# Patient Record
Sex: Female | Born: 1940 | Race: Asian | Hispanic: No | Smoking: Never smoker
Health system: Southern US, Community
[De-identification: ages and names within clinical notes are randomized; demographics above are authoritative.]

## PROBLEM LIST (undated history)

## (undated) DIAGNOSIS — E209 Hypoparathyroidism, unspecified: Secondary | ICD-10-CM

## (undated) DIAGNOSIS — I10 Essential (primary) hypertension: Secondary | ICD-10-CM

## (undated) DIAGNOSIS — K635 Polyp of colon: Secondary | ICD-10-CM

## (undated) DIAGNOSIS — R42 Dizziness and giddiness: Secondary | ICD-10-CM

## (undated) DIAGNOSIS — IMO0002 Reserved for concepts with insufficient information to code with codable children: Secondary | ICD-10-CM

## (undated) DIAGNOSIS — C50919 Malignant neoplasm of unspecified site of unspecified female breast: Secondary | ICD-10-CM

## (undated) DIAGNOSIS — E785 Hyperlipidemia, unspecified: Secondary | ICD-10-CM

## (undated) DIAGNOSIS — E039 Hypothyroidism, unspecified: Secondary | ICD-10-CM

## (undated) DIAGNOSIS — C801 Malignant (primary) neoplasm, unspecified: Secondary | ICD-10-CM

## (undated) HISTORY — DX: Essential (primary) hypertension: I10

## (undated) HISTORY — DX: Hyperlipidemia, unspecified: E78.5

## (undated) HISTORY — DX: Polyp of colon: K63.5

## (undated) HISTORY — DX: Malignant neoplasm of unspecified site of unspecified female breast: C50.919

## (undated) HISTORY — DX: Dizziness and giddiness: R42

## (undated) HISTORY — DX: Reserved for concepts with insufficient information to code with codable children: IMO0002

## (undated) HISTORY — DX: Hypothyroidism, unspecified: E03.9

## (undated) HISTORY — DX: Malignant (primary) neoplasm, unspecified: C80.1

## (undated) HISTORY — PX: THYROIDECTOMY: SHX17

## (undated) HISTORY — DX: Hypoparathyroidism, unspecified: E20.9

---

## 1993-11-10 ENCOUNTER — Encounter: Payer: Self-pay | Admitting: Family Medicine

## 1995-08-02 DIAGNOSIS — C50919 Malignant neoplasm of unspecified site of unspecified female breast: Secondary | ICD-10-CM

## 1995-08-02 HISTORY — DX: Malignant neoplasm of unspecified site of unspecified female breast: C50.919

## 1996-08-01 HISTORY — PX: BREAST BIOPSY: SHX20

## 1997-10-30 ENCOUNTER — Encounter: Admission: RE | Admit: 1997-10-30 | Discharge: 1998-01-28 | Payer: Self-pay | Admitting: Radiation Oncology

## 1998-03-30 ENCOUNTER — Other Ambulatory Visit: Admission: RE | Admit: 1998-03-30 | Discharge: 1998-03-30 | Payer: Self-pay | Admitting: *Deleted

## 1998-05-13 ENCOUNTER — Encounter: Payer: Self-pay | Admitting: Family Medicine

## 1999-02-24 ENCOUNTER — Other Ambulatory Visit: Admission: RE | Admit: 1999-02-24 | Discharge: 1999-02-24 | Payer: Self-pay | Admitting: *Deleted

## 1999-09-08 ENCOUNTER — Encounter: Admission: RE | Admit: 1999-09-08 | Discharge: 1999-09-08 | Payer: Self-pay | Admitting: Family Medicine

## 1999-09-08 ENCOUNTER — Encounter: Payer: Self-pay | Admitting: Family Medicine

## 1999-12-20 ENCOUNTER — Encounter: Admission: RE | Admit: 1999-12-20 | Discharge: 2000-01-05 | Payer: Self-pay | Admitting: Orthopedic Surgery

## 2000-02-25 ENCOUNTER — Other Ambulatory Visit: Admission: RE | Admit: 2000-02-25 | Discharge: 2000-02-25 | Payer: Self-pay | Admitting: *Deleted

## 2000-04-27 ENCOUNTER — Ambulatory Visit (HOSPITAL_COMMUNITY): Admission: RE | Admit: 2000-04-27 | Discharge: 2000-04-27 | Payer: Self-pay | Admitting: Gastroenterology

## 2000-04-27 ENCOUNTER — Encounter (INDEPENDENT_AMBULATORY_CARE_PROVIDER_SITE_OTHER): Payer: Self-pay | Admitting: *Deleted

## 2000-10-05 ENCOUNTER — Encounter: Admission: RE | Admit: 2000-10-05 | Discharge: 2000-10-05 | Payer: Self-pay | Admitting: Family Medicine

## 2000-10-05 ENCOUNTER — Encounter: Payer: Self-pay | Admitting: Family Medicine

## 2001-02-19 ENCOUNTER — Other Ambulatory Visit: Admission: RE | Admit: 2001-02-19 | Discharge: 2001-02-19 | Payer: Self-pay | Admitting: *Deleted

## 2001-10-09 ENCOUNTER — Encounter: Payer: Self-pay | Admitting: Family Medicine

## 2001-10-09 ENCOUNTER — Encounter: Admission: RE | Admit: 2001-10-09 | Discharge: 2001-10-09 | Payer: Self-pay | Admitting: Family Medicine

## 2001-10-19 ENCOUNTER — Encounter: Payer: Self-pay | Admitting: Family Medicine

## 2001-12-13 ENCOUNTER — Other Ambulatory Visit: Admission: RE | Admit: 2001-12-13 | Discharge: 2001-12-13 | Payer: Self-pay | Admitting: *Deleted

## 2002-05-07 ENCOUNTER — Ambulatory Visit: Admission: RE | Admit: 2002-05-07 | Discharge: 2002-05-07 | Payer: Self-pay | Admitting: Radiation Oncology

## 2002-10-11 ENCOUNTER — Encounter: Admission: RE | Admit: 2002-10-11 | Discharge: 2002-10-11 | Payer: Self-pay | Admitting: Family Medicine

## 2002-10-11 ENCOUNTER — Encounter: Payer: Self-pay | Admitting: Family Medicine

## 2003-01-02 ENCOUNTER — Other Ambulatory Visit: Admission: RE | Admit: 2003-01-02 | Discharge: 2003-01-02 | Payer: Self-pay | Admitting: *Deleted

## 2003-10-14 ENCOUNTER — Encounter: Admission: RE | Admit: 2003-10-14 | Discharge: 2003-10-14 | Payer: Self-pay | Admitting: Family Medicine

## 2004-01-07 ENCOUNTER — Other Ambulatory Visit: Admission: RE | Admit: 2004-01-07 | Discharge: 2004-01-07 | Payer: Self-pay | Admitting: *Deleted

## 2004-10-15 ENCOUNTER — Encounter: Admission: RE | Admit: 2004-10-15 | Discharge: 2004-10-15 | Payer: Self-pay

## 2005-01-04 ENCOUNTER — Encounter: Payer: Self-pay | Admitting: Family Medicine

## 2005-02-24 ENCOUNTER — Other Ambulatory Visit: Admission: RE | Admit: 2005-02-24 | Discharge: 2005-02-24 | Payer: Self-pay | Admitting: *Deleted

## 2005-05-12 ENCOUNTER — Encounter (INDEPENDENT_AMBULATORY_CARE_PROVIDER_SITE_OTHER): Payer: Self-pay | Admitting: *Deleted

## 2005-05-12 ENCOUNTER — Ambulatory Visit (HOSPITAL_COMMUNITY): Admission: RE | Admit: 2005-05-12 | Discharge: 2005-05-12 | Payer: Self-pay | Admitting: Gastroenterology

## 2005-10-26 ENCOUNTER — Encounter: Admission: RE | Admit: 2005-10-26 | Discharge: 2005-10-26 | Payer: Self-pay | Admitting: Family Medicine

## 2005-12-08 ENCOUNTER — Emergency Department (HOSPITAL_COMMUNITY): Admission: EM | Admit: 2005-12-08 | Discharge: 2005-12-08 | Payer: Self-pay | Admitting: Emergency Medicine

## 2006-01-18 ENCOUNTER — Ambulatory Visit: Payer: Self-pay | Admitting: Internal Medicine

## 2006-01-25 ENCOUNTER — Ambulatory Visit: Payer: Self-pay | Admitting: Internal Medicine

## 2006-08-01 LAB — CONVERTED CEMR LAB: Pap Smear: NORMAL

## 2006-08-29 ENCOUNTER — Encounter: Payer: Self-pay | Admitting: Family Medicine

## 2006-10-16 ENCOUNTER — Encounter: Admission: RE | Admit: 2006-10-16 | Discharge: 2006-10-16 | Payer: Self-pay | Admitting: Family Medicine

## 2006-10-19 ENCOUNTER — Encounter: Admission: RE | Admit: 2006-10-19 | Discharge: 2006-10-19 | Payer: Self-pay | Admitting: Family Medicine

## 2007-01-19 ENCOUNTER — Other Ambulatory Visit: Admission: RE | Admit: 2007-01-19 | Discharge: 2007-01-19 | Payer: Self-pay | Admitting: Obstetrics & Gynecology

## 2007-10-19 ENCOUNTER — Encounter: Admission: RE | Admit: 2007-10-19 | Discharge: 2007-10-19 | Payer: Self-pay | Admitting: Obstetrics & Gynecology

## 2008-01-11 ENCOUNTER — Encounter: Payer: Self-pay | Admitting: Family Medicine

## 2008-06-04 ENCOUNTER — Ambulatory Visit: Payer: Self-pay | Admitting: Internal Medicine

## 2008-06-18 ENCOUNTER — Ambulatory Visit: Payer: Self-pay | Admitting: Internal Medicine

## 2008-06-18 ENCOUNTER — Encounter: Payer: Self-pay | Admitting: Internal Medicine

## 2008-06-18 LAB — HM COLONOSCOPY

## 2008-06-20 ENCOUNTER — Encounter: Payer: Self-pay | Admitting: Internal Medicine

## 2008-08-19 ENCOUNTER — Encounter: Payer: Self-pay | Admitting: Family Medicine

## 2008-10-21 ENCOUNTER — Encounter: Admission: RE | Admit: 2008-10-21 | Discharge: 2008-10-21 | Payer: Self-pay | Admitting: Obstetrics & Gynecology

## 2008-10-21 LAB — HM MAMMOGRAPHY: HM Mammogram: NORMAL

## 2009-01-06 ENCOUNTER — Ambulatory Visit: Payer: Self-pay | Admitting: Family Medicine

## 2009-01-06 DIAGNOSIS — M5137 Other intervertebral disc degeneration, lumbosacral region: Secondary | ICD-10-CM | POA: Insufficient documentation

## 2009-01-06 DIAGNOSIS — R4589 Other symptoms and signs involving emotional state: Secondary | ICD-10-CM | POA: Insufficient documentation

## 2009-01-06 DIAGNOSIS — E039 Hypothyroidism, unspecified: Secondary | ICD-10-CM | POA: Insufficient documentation

## 2009-01-06 DIAGNOSIS — I10 Essential (primary) hypertension: Secondary | ICD-10-CM | POA: Insufficient documentation

## 2009-01-06 DIAGNOSIS — E785 Hyperlipidemia, unspecified: Secondary | ICD-10-CM

## 2009-01-06 DIAGNOSIS — M51379 Other intervertebral disc degeneration, lumbosacral region without mention of lumbar back pain or lower extremity pain: Secondary | ICD-10-CM | POA: Insufficient documentation

## 2009-02-04 ENCOUNTER — Telehealth: Payer: Self-pay | Admitting: Family Medicine

## 2009-02-12 ENCOUNTER — Telehealth: Payer: Self-pay | Admitting: Family Medicine

## 2009-02-20 ENCOUNTER — Ambulatory Visit: Payer: Self-pay | Admitting: Family Medicine

## 2009-02-20 DIAGNOSIS — Z8669 Personal history of other diseases of the nervous system and sense organs: Secondary | ICD-10-CM | POA: Insufficient documentation

## 2009-03-03 ENCOUNTER — Telehealth (INDEPENDENT_AMBULATORY_CARE_PROVIDER_SITE_OTHER): Payer: Self-pay | Admitting: *Deleted

## 2009-03-03 ENCOUNTER — Encounter: Payer: Self-pay | Admitting: Family Medicine

## 2009-03-05 ENCOUNTER — Encounter: Payer: Self-pay | Admitting: Family Medicine

## 2009-03-05 ENCOUNTER — Telehealth: Payer: Self-pay | Admitting: Family Medicine

## 2009-03-06 ENCOUNTER — Encounter: Payer: Self-pay | Admitting: Family Medicine

## 2009-03-09 ENCOUNTER — Telehealth: Payer: Self-pay | Admitting: Family Medicine

## 2009-03-09 ENCOUNTER — Encounter: Payer: Self-pay | Admitting: Family Medicine

## 2009-05-13 ENCOUNTER — Telehealth: Payer: Self-pay | Admitting: Family Medicine

## 2009-05-18 ENCOUNTER — Telehealth: Payer: Self-pay | Admitting: Family Medicine

## 2009-05-18 ENCOUNTER — Encounter: Payer: Self-pay | Admitting: Family Medicine

## 2009-05-22 ENCOUNTER — Ambulatory Visit: Payer: Self-pay | Admitting: Endocrinology

## 2009-05-22 ENCOUNTER — Other Ambulatory Visit: Admission: RE | Admit: 2009-05-22 | Discharge: 2009-05-22 | Payer: Self-pay | Admitting: Endocrinology

## 2009-05-22 ENCOUNTER — Encounter: Payer: Self-pay | Admitting: Endocrinology

## 2009-05-26 ENCOUNTER — Telehealth: Payer: Self-pay | Admitting: Endocrinology

## 2009-06-04 ENCOUNTER — Encounter: Admission: RE | Admit: 2009-06-04 | Discharge: 2009-06-04 | Payer: Self-pay | Admitting: General Surgery

## 2009-06-18 ENCOUNTER — Telehealth: Payer: Self-pay | Admitting: Internal Medicine

## 2009-06-30 ENCOUNTER — Ambulatory Visit (HOSPITAL_COMMUNITY): Admission: RE | Admit: 2009-06-30 | Discharge: 2009-07-01 | Payer: Self-pay | Admitting: General Surgery

## 2009-06-30 ENCOUNTER — Encounter (INDEPENDENT_AMBULATORY_CARE_PROVIDER_SITE_OTHER): Payer: Self-pay | Admitting: General Surgery

## 2009-07-02 ENCOUNTER — Telehealth: Payer: Self-pay | Admitting: Family Medicine

## 2009-08-19 ENCOUNTER — Ambulatory Visit: Payer: Self-pay | Admitting: Family Medicine

## 2009-08-19 LAB — CONVERTED CEMR LAB
AST: 36 units/L (ref 0–37)
Albumin: 4.4 g/dL (ref 3.5–5.2)
Alkaline Phosphatase: 53 units/L (ref 39–117)
BUN: 15 mg/dL (ref 6–23)
Basophils Absolute: 0 10*3/uL (ref 0.0–0.1)
Basophils Relative: 0.2 % (ref 0.0–3.0)
Bilirubin, Direct: 0.1 mg/dL (ref 0.0–0.3)
CO2: 31 meq/L (ref 19–32)
Chloride: 102 meq/L (ref 96–112)
Cholesterol: 250 mg/dL — ABNORMAL HIGH (ref 0–200)
Eosinophils Absolute: 0.1 10*3/uL (ref 0.0–0.7)
Glucose, Bld: 93 mg/dL (ref 70–99)
HDL: 70.3 mg/dL (ref >39.00)
Hemoglobin: 13.6 g/dL (ref 12.0–15.0)
Lymphs Abs: 2 10*3/uL (ref 0.7–4.0)
MCV: 98.1 fL (ref 78.0–100.0)
Monocytes Absolute: 0.3 10*3/uL (ref 0.1–1.0)
Monocytes Relative: 5.2 % (ref 3.0–12.0)
Neutro Abs: 2.5 10*3/uL (ref 1.4–7.7)
Neutrophils Relative %: 52.5 % (ref 43.0–77.0)
Potassium: 3.4 meq/L — ABNORMAL LOW (ref 3.5–5.1)
RBC: 4.18 M/uL (ref 3.87–5.11)
Total Bilirubin: 1 mg/dL (ref 0.3–1.2)
Total CHOL/HDL Ratio: 4
Total Protein: 7 g/dL (ref 6.0–8.3)
Triglycerides: 140 mg/dL (ref 0.0–149.0)
VLDL: 28 mg/dL (ref 0.0–40.0)

## 2009-08-21 ENCOUNTER — Ambulatory Visit: Payer: Self-pay | Admitting: Family Medicine

## 2009-08-21 DIAGNOSIS — Z780 Asymptomatic menopausal state: Secondary | ICD-10-CM | POA: Insufficient documentation

## 2009-08-21 DIAGNOSIS — C73 Malignant neoplasm of thyroid gland: Secondary | ICD-10-CM | POA: Insufficient documentation

## 2009-08-26 ENCOUNTER — Encounter: Payer: Self-pay | Admitting: Family Medicine

## 2009-08-31 ENCOUNTER — Encounter (HOSPITAL_COMMUNITY): Admission: RE | Admit: 2009-08-31 | Discharge: 2009-11-12 | Payer: Self-pay | Admitting: Endocrinology

## 2009-09-10 ENCOUNTER — Telehealth: Payer: Self-pay | Admitting: Family Medicine

## 2009-10-22 ENCOUNTER — Encounter: Admission: RE | Admit: 2009-10-22 | Discharge: 2009-10-22 | Payer: Self-pay | Admitting: Obstetrics & Gynecology

## 2009-10-30 ENCOUNTER — Ambulatory Visit: Payer: Self-pay | Admitting: Internal Medicine

## 2009-10-30 ENCOUNTER — Encounter: Payer: Self-pay | Admitting: Family Medicine

## 2009-11-16 ENCOUNTER — Encounter (INDEPENDENT_AMBULATORY_CARE_PROVIDER_SITE_OTHER): Payer: Self-pay | Admitting: *Deleted

## 2010-02-12 ENCOUNTER — Ambulatory Visit: Payer: Self-pay | Admitting: Family Medicine

## 2010-02-12 DIAGNOSIS — F4321 Adjustment disorder with depressed mood: Secondary | ICD-10-CM

## 2010-03-03 ENCOUNTER — Telehealth: Payer: Self-pay | Admitting: Family Medicine

## 2010-04-16 ENCOUNTER — Ambulatory Visit: Payer: Self-pay | Admitting: Family Medicine

## 2010-04-17 ENCOUNTER — Encounter: Payer: Self-pay | Admitting: Family Medicine

## 2010-04-19 LAB — CONVERTED CEMR LAB
Alkaline Phosphatase: 66 units/L (ref 39–117)
BUN: 15 mg/dL (ref 6–23)
Calcium: 10.1 mg/dL (ref 8.4–10.5)
Cholesterol: 193 mg/dL (ref 0–200)
HDL: 58 mg/dL (ref >39)
LDL Cholesterol: 83 mg/dL (ref 0–99)
Potassium: 3.8 meq/L (ref 3.5–5.3)
Total Bilirubin: 0.4 mg/dL (ref 0.3–1.2)
Total CHOL/HDL Ratio: 3.3
Total Protein: 7.3 g/dL (ref 6.0–8.3)
VLDL: 52 mg/dL — ABNORMAL HIGH (ref 0–40)

## 2010-04-20 ENCOUNTER — Encounter: Payer: Self-pay | Admitting: Family Medicine

## 2010-04-26 ENCOUNTER — Ambulatory Visit (HOSPITAL_COMMUNITY): Admission: RE | Admit: 2010-04-26 | Discharge: 2010-04-26 | Payer: Self-pay | Admitting: Endocrinology

## 2010-05-10 ENCOUNTER — Ambulatory Visit: Payer: Self-pay | Admitting: Family Medicine

## 2010-05-10 DIAGNOSIS — G250 Essential tremor: Secondary | ICD-10-CM

## 2010-05-10 DIAGNOSIS — G252 Other specified forms of tremor: Secondary | ICD-10-CM

## 2010-06-02 ENCOUNTER — Emergency Department (HOSPITAL_COMMUNITY): Admission: EM | Admit: 2010-06-02 | Discharge: 2010-06-02 | Payer: Self-pay | Admitting: Emergency Medicine

## 2010-06-04 ENCOUNTER — Ambulatory Visit: Payer: Self-pay | Admitting: Family Medicine

## 2010-06-15 ENCOUNTER — Telehealth: Payer: Self-pay | Admitting: Family Medicine

## 2010-06-16 ENCOUNTER — Encounter: Payer: Self-pay | Admitting: Family Medicine

## 2010-06-22 ENCOUNTER — Telehealth: Payer: Self-pay | Admitting: Family Medicine

## 2010-06-28 ENCOUNTER — Telehealth: Payer: Self-pay | Admitting: Family Medicine

## 2010-06-28 ENCOUNTER — Encounter: Payer: Self-pay | Admitting: Family Medicine

## 2010-07-08 ENCOUNTER — Ambulatory Visit: Payer: Self-pay | Admitting: Family Medicine

## 2010-07-08 DIAGNOSIS — B0229 Other postherpetic nervous system involvement: Secondary | ICD-10-CM | POA: Insufficient documentation

## 2010-07-08 LAB — CONVERTED CEMR LAB
Cholesterol, target level: 200 mg/dL
HDL goal, serum: 40 mg/dL
LDL Goal: 130 mg/dL

## 2010-07-16 LAB — CONVERTED CEMR LAB: Free T4: 1.75 ng/dL — ABNORMAL HIGH (ref 0.60–1.60)

## 2010-07-30 ENCOUNTER — Encounter: Payer: Self-pay | Admitting: Family Medicine

## 2010-08-01 HISTORY — PX: LARYNGECTOMY: SUR815

## 2010-08-10 ENCOUNTER — Encounter: Payer: Self-pay | Admitting: Family Medicine

## 2010-08-27 ENCOUNTER — Ambulatory Visit
Admission: RE | Admit: 2010-08-27 | Discharge: 2010-08-27 | Payer: Self-pay | Source: Home / Self Care | Attending: Family Medicine | Admitting: Family Medicine

## 2010-08-27 ENCOUNTER — Other Ambulatory Visit: Payer: Self-pay | Admitting: Family Medicine

## 2010-08-27 LAB — T4, FREE: Free T4: 2.08 ng/dL — ABNORMAL HIGH (ref 0.60–1.60)

## 2010-09-02 NOTE — Assessment & Plan Note (Signed)
Summary: F/U/CLE   Vital Signs:  Patient profile:   70 year old female Height:      58.25 inches Weight:      120.50 pounds BMI:     25.06 Temp:     98.1 degrees F oral Pulse rate:   80 / minute Pulse rhythm:   regular BP sitting:   146 / 84  (left arm) Cuff size:   regular  Vitals Entered By: Lewanda Rife LPN (February 12, 2010 1:42 PM) CC: follow-up visit   History of Present Illness: here for f/u of HTN and lipid and hypothyroid  has lost husband since last visit- in april of cardiac problem  is seeing a counselor for grief  has good friends and her son came back home -- is living with him now   was started on thyroid repl at 100 micrograms by Dr Evlyn Kanner s/p thyroidect  next appt until next mo - may have to do a bit more surgery-  ? unsure  has 3% of thyroid left on R side because it was close to vocal cords  had to take oidine tx for that   is off the thyroid repl now until ? done with iodine   is on viibryd for antidepressant -- is starting to help a little   had a resp illness with phlem in throat - was tx with abx for 2 weeks  then went to ENT doctor (Dr Pollyann Kennedy) is chronically hoarse - ? damaged R side of nerve ? if voice training will help  wt is down 20 lb not much appetite with the grief  now is eating anything she can eat  bp up a bit on first check 146/84 was upset talking during this     Allergies: 1)  ! Prednisone  Past History:  Past Surgical History: Last updated: 07/01/2009  1998 Breast BX and radiation tx -- breast cancer  Bunion surgery nl dexa 6/06 R total thyroidectomy 11/10  Family History: Last updated: 08/21/2009 Family History of Arthritis (parent, other relative) Family History of Colon CA (other relative) Family History High cholesterol(parent, other relative) Family History Hypertension(parent, other relative) Family History Stroke (parent) Family History Heart Disease (parent) sister has uncertain type of thyroid  problem. 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 2 sons  one grandson  lost husb to coronary artery disease in 4/11 moved from [place] in 1987 Never Smoked Alcohol use-no Drug use-no Regular exercise-yes  Risk Factors: Exercise: yes (01/06/2009)  Risk Factors: Smoking Status: never (01/06/2009)  Past Medical History: lumbar deg disk - with back pain (s/p injections) Thyroid problem Vertigo 2007/2010 hearing loss- aide Hyperlipidemia Hypertension colon polyps breast cancer - caught very early -- bx was curative, then 56 radiology treatment  adenocarcinoma thyroid with thyroidectomy  GI-Dr Marina Goodell  ortho- Dr Priscille Kluver, Dr Alvester Morin (spine injection) surg - Dr Luan Pulling  ENT - Pollyann Kennedy surg- Dr Zachery Dakins  Social History: 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 Eyes:  Denies blurring and eye irritation. CV:  Denies chest pain or discomfort, lightheadness, and shortness of breath with exertion. Resp:  Denies cough and wheezing. GI:  Denies abdominal pain,  change in bowel habits, indigestion, and nausea. GU:  Denies urinary frequency. MS:  Complains of stiffness; denies joint redness, joint swelling, and muscle aches. Derm:  Denies itching, lesion(s), poor wound healing, and rash. Neuro:  Denies headaches, numbness, and tingling. Psych:  Complains of depression; denies panic attacks, sense of great danger, and suicidal thoughts/plans. Endo:  Complains of cold intolerance; denies excessive thirst, excessive urination, and heat intolerance. Heme:  Denies abnormal bruising and bleeding.  Physical Exam  General:  fatigued but well  appearing wt loss noted  Head:  normocephalic, atraumatic, and no abnormalities observed.   Eyes:  vision grossly intact, pupils equal, pupils round, and pupils reactive to light.  no conjunctival pallor, injection or icterus  no exopthalmos Mouth:  pharynx pink and moist.   hoarse voice noted Neck:  supple with full rom and no masses or thyromegally, no JVD or carotid bruit  healed thyroid scar  Chest Wall:  No deformities, masses, or tenderness noted. Lungs:  Normal respiratory effort, chest expands symmetrically. Lungs are clear to auscultation, no crackles or wheezes. Heart:  Normal rate and regular rhythm. S1 and S2 normal without gallop, murmur, click, rub or other extra sounds. Msk:  No deformity or scoliosis noted of thoracic or lumbar spine.   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 1/2  tab by mouth daily    Losartan Potassium 100 Mg Tabs (Losartan potassium) .Marland Kitchen... Take 1 tablet by mouth once a day  BP today: 146/84 Prior BP: 130/70 (08/21/2009)  Labs Reviewed: K+: 3.4 (08/19/2009) Creat: : 1.0 (08/19/2009)   Chol: 250 (08/19/2009)   HDL: 70.30 (08/19/2009)   TG: 140.0  (08/19/2009)  Problem # 3:  GRIEF REACTION, ACUTE (ICD-309.0) Assessment: New disc this in detail  symptoms are improving gradually and pt continues med and counseling-I enc this  her own health problems are compounding the depressive aspect  did stress imp of seeking help if symptoms worsen or any thoughts of self harm- pt voiced understanding   Complete Medication List: 1)  Chlorthalidone 25 Mg Tabs (Chlorthalidone) .... 1/2  tab by mouth daily 2)  K Cl 20 Meq  .... Take 1 tablet by mouth once a day 3)  Simvastatin 80 Mg Tabs (Simvastatin) .... Take 1 tablet by mouth once a day 4)  Aspirin 81 Mg Tabs (Aspirin) .... Take 1 tablet by mouth once a day 5)  Multivitamins Tabs (Multiple vitamin) .... Take 1 tablet by mouth once a day 6)  Oscal 500/200 D-3 500-200 Mg-unit Tabs (Calcium-vitamin d) .... Take 2 tablet by mouth once a day 7)  Losartan Potassium 100 Mg Tabs (Losartan potassium) .... Take 1 tablet by mouth once a day 8)  Viibryd 40mg   .Marland KitchenMarland KitchenMarland Kitchen  Take 1/2 tablet by mouth daily  Patient Instructions: 1)  please send for last note from ENT Dr Pollyann Kennedy  2)  please send for last note from surgeon- Dr Zachery Dakins 3)  continue your counseling 4)  continue your antidepressent medication  5)  continue follow up with Dr Evlyn Kanner  6)  please let me know if you need anything  7)  follow up with me in about 3 months (I will do labs that day)   Current Allergies (reviewed today): ! PREDNISONE

## 2010-09-02 NOTE — Progress Notes (Signed)
Summary: K CL rx  Phone Note Refill Request Call back at [phone] Message from:  Patient's husband,Ron on September 10, 2009 2:33 PM  Refills Requested: Medication #1:  K CL 20 MEQ Take 1 tablet by mouth once a day Pt's husband request a written rx for K CL for #90 with 3 additional refills mailed to his home address. Please advise.    Method Requested: Mail to Patient Initial call taken by: Lewanda Rife LPN,  September 10, 2009 2:37 PM  Follow-up for Phone Call        printed in put in nurse in box for pickup   Follow-up by: Judith Part MD,  September 11, 2009 8:01 AM  Additional Follow-up for Phone Call Additional follow up Details #1::        Prescription mailed to patient as instructed. Patient notified as instructed by telephone. Lewanda Rife LPN  September 11, 2009 8:36 AM     Prescriptions: K CL 20 MEQ Take 1 tablet by mouth once a day  #90 x 3   Entered and Authorized by:   Judith Part MD   Signed by:   Judith Part MD on 09/11/2009   Method used:   Print then Mail to Patient   RxID:   [id number]

## 2010-09-02 NOTE — Assessment & Plan Note (Signed)
Summary: Right hand shakes, discuss referral to neuro   Vital Signs:  Patient profile:   70 year old female Height:      58.25 inches Weight:      124.75 pounds BMI:     25.94 Temp:     98.3 degrees F oral Pulse rate:   84 / minute Pulse rhythm:   regular BP sitting:   128 / 80  (left arm) Cuff size:   regular  Vitals Entered By: Lewanda Rife LPN (May 10, 2010 11:58 AM) CC: right hand shakes, wants to talk about neurological referral   History of Present Illness: has had bilateral hand tremor for a long time -- started in 20s  thinks it is getting worse ? -- not too sure  friend is worried about parkinsons  sister thinks she has a head tremor  she does not notice it   hand tremor is on intention  not at rest  does not interfere with activity   wants to continue antihistamine at night for post nasal drip   Allergies: 1)  ! Prednisone  Past History:  Past Medical History: Last updated: 02/12/2010 lumbar deg disk - with back pain (s/p injections) Thyroid problem Vertigo 2007/2010 hearing loss- aide Hyperlipidemia Hypertension colon polyps breast cancer - caught very early -- bx was curative, then 56 radiology treatment  adenocarcinoma thyroid with thyroidectomy  GI-Dr Marina Goodell  ortho- Dr Priscille Kluver, Dr Alvester Morin (spine injection) surg - Dr Luan Pulling  ENT - Pollyann Kennedy surg- Dr Zachery Dakins  Past Surgical History: Last updated: 07/01/2009  1998 Breast BX and radiation tx -- breast cancer  Bunion surgery nl dexa 6/06 R total thyroidectomy 11/10  Family History: Last updated: 08/21/2009 Family History of Arthritis (parent, other relative) Family History of Colon CA (other relative) Family History High cholesterol(parent, other relative) Family History Hypertension(parent, other relative) Family History Stroke (parent) Family History Heart Disease (parent) sister has uncertain type of thyroid problem. 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 Eyes:  Denies blurring and double vision. CV:  Denies chest pain or discomfort, lightheadness, palpitations, and shortness of breath with exertion. Resp:  Denies cough and shortness of breath. GI:  Denies abdominal pain, change in bowel habits, and indigestion. MS:  Complains of stiffness; denies muscle aches, cramps, and muscle weakness. Derm:  Denies rash. Neuro:  Denies falling down, headaches, memory loss, numbness, sensation of room spinning, tingling, visual disturbances, and weakness. Endo:  Denies cold intolerance, excessive thirst, excessive urination, and heat intolerance. Heme:  Denies abnormal bruising and bleeding.  Physical Exam  General:  fatigued but well appearing wt loss noted  Head:  normocephalic, atraumatic, and no abnormalities observed.  no head tremor noted today Eyes:  vision grossly intact, pupils equal, pupils round, and pupils reactive to light.  no conjunctival pallor, injection or icterus  Mouth:  pharynx pink and moist.   Neck:  supple with full rom and no masses or thyromegally, no JVD or carotid bruit  healed thyroid scar  Lungs:  Normal respiratory effort, chest expands symmetrically. Lungs are clear to auscultation, no crackles or  wheezes. Heart:  Normal rate and regular rhythm. S1 and S2 normal without gallop, murmur, click, rub or other extra sounds. 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 1/2  tab by mouth daily 2)  K Cl 20 Meq  .... Take 1 tablet by mouth once a day 3)  Simvastatin 80 Mg Tabs (Simvastatin) .... Take 1 tablet by mouth once a day 4)  Aspirin 81 Mg Tabs (Aspirin) .... Take 1 tablet by mouth once a day 5)  Multivitamins Tabs (Multiple vitamin) .... Take 1 tablet by mouth once a day 6)  Oscal 500/200 D-3 500-200 Mg-unit Tabs (Calcium-vitamin d) .... Take 2 tablet by mouth once a day 7)  Losartan Potassium 100 Mg Tabs (Losartan potassium) .... Take 1 tablet by mouth once a day 8)  Viibryd 40mg   .... 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 PREDNISONE

## 2010-09-02 NOTE — Letter (Signed)
Summary: [facility]  [facility]   Imported By: Maryln Gottron 07/16/2010 14:38:46  _____________________________________________________________________  External Attachment:    Type:   Image     Comment:   External Document

## 2010-09-02 NOTE — Miscellaneous (Signed)
Summary: BONE DENSITY  Clinical Lists Changes  Orders: Added new Test order of T-Bone Densitometry (77080) - Signed Added new Test order of T-Lumbar Vertebral Assessment (77082) - Signed 

## 2010-09-02 NOTE — Progress Notes (Signed)
Summary: Wants pain med  Phone Note Call from Patient Call back at [phone]   Caller: Patient Call For: Judith Part MD Summary of Call: Pt said has finished Valtrex and Percocet. Pt said rash is looking better and drying up. Pt got the cream and it has helped but the Aleve or Advil is not helping the pain.  Pt said Percocet helped the pain. Pt request pain med to be called in to [facility]. Pt will decide which [facility] and call me back with that info.Lewanda Rife LPN  June 22, 2010 12:45 PM   Pt uses [facility].  Lowella Petties CMA, AAMA  June 22, 2010 12:59 PM   Follow-up for Phone Call        percocet is very strong so I'm going to step down a bit to vicodin which is able to be called in  also -- I advise she should start some low dose neurontin two times a day--- this is a special drug for nerve pain (since the pain is lasting a while) expect to be a bit dizzy and sedated for first few days -- most of the time side eff stop after that follow up with me in 1-2 weeks for re check please px written on EMR for call in  Follow-up by: Judith Part MD,  June 22, 2010 3:17 PM  Additional Follow-up for Phone Call Additional follow up Details #1::        Medication phoned to [facility] pharmacy as instructed. Patient notified as instructed by telephone. Pt scheduled appt to see Dr Milinda Antis 07/07/10 at 3pm.Rena Isley LPN  June 22, 2010 4:05 PM     New/Updated Medications: VICODIN 5-500 MG TABS (HYDROCODONE-ACETAMINOPHEN) 1-2 by mouth up to every 6 hours as needed severe pain watch for sedation NEURONTIN 300 MG CAPS (GABAPENTIN) 1 by mouth two times a day for shingles pain  caution for sedation Prescriptions: NEURONTIN 300 MG CAPS (GABAPENTIN) 1 by mouth two times a day for shingles pain  caution for sedation  #60 x 3   Entered and Authorized by:   Judith Part MD   Signed by:   Lewanda Rife LPN on 40/98/1191   Method used:   Telephoned to ...       [facility]       [address]       [place], [place]  [address]       Ph: [phone]       Fax: [phone]   RxID:   [id number] VICODIN 5-500 MG TABS (HYDROCODONE-ACETAMINOPHEN) 1-2 by mouth up to every 6 hours as needed severe pain watch for sedation  #30 x 0   Entered and Authorized by:   Judith Part MD   Signed by:   Lewanda Rife LPN on 44/09/4740   Method used:   Telephoned to ...       [facility]       [address]       [place], [place]  [address]       Ph: [phone]       Fax: [phone]   RxID:   [id number]

## 2010-09-02 NOTE — Assessment & Plan Note (Signed)
Summary: cpx/bir   Vital Signs:  Patient profile:   70 year old female Height:      58.25 inches Weight:      141 pounds BMI:     29.32 Temp:     97.6 degrees F oral Pulse rate:   76 / minute Pulse rhythm:   regular BP sitting:   130 / 70  (left arm) Cuff size:   regular  Vitals Entered By: Lowella Petties CMA (August 21, 2009 2:21 PM) CC: 30 minute check up   History of Present Illness: here for check up of chronic medical problems and also to disc chronic med problems does not feel up to doing PE today however   pt had thyroidectomy for papillary carcinoma since last visit is feeling terrible without any thyroid hormone -- and face feels swollen, and having trouble with concentration had appt with Dr Evlyn Kanner next wed y (had to re schedule because of weather) sees Dr Zachery Dakins next week as well    wt is up 2 lb is eating fairly - not too bad   recent tsh - over 100 as expected without supplementation   lipid check high - with LDL 161 (this may also be aff by thyroid)  HTN - bp is well controlled 130/70  colonosc due 2014 (Dr Marina Goodell)  pap was 08  recent K 3.4  ast 49  mam 3/10 - normal  self exam   dexa nl in 06  does take her ca and vit D - had prev cut down her ca now ready to inc it   Td 09  is having a lot of phlegm - in back of throat -- ? cold or allergy   Allergies: 1)  ! Prednisone  Past History:  Past Medical History: Last updated: 07/01/2009 lumbar deg disk - with back pain (s/p injections) Thyroid problem Vertigo 2007/2010 hearing loss- aide Hyperlipidemia Hypertension colon polyps breast cancer - caught very early -- bx was curative, then 55 radiology treatment   GI-Dr Marina Goodell  ortho- Dr Priscille Kluver, Dr Alvester Morin (spine injection) surg - Dr Luan Pulling  ENT - [facility] surg- Dr Zachery Dakins  Past Surgical History: Last updated: 07/01/2009  1998 Breast BX and radiation tx -- breast cancer  Bunion surgery nl dexa 6/06 R total thyroidectomy  11/10  Family History: Last updated: 08/21/2009 Family History of Arthritis (parent, other relative) Family History of Colon CA (other relative) Family History High cholesterol(parent, other relative) Family History Hypertension(parent, other relative) Family History Stroke (parent) Family History Heart Disease (parent) sister has uncertain type of thyroid problem. 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 2 sons  one grandson  Married (husband from [place])-- [place] -- husband had CABG - bad CAD moved from [place] in 1987 Never Smoked Alcohol use-no Drug use-no Regular exercise-yes  Risk Factors: Exercise: yes (01/06/2009)  Risk Factors: Smoking Status: never (01/06/2009)  Family History: Family History of Arthritis (parent, other relative) Family History of Colon CA (other relative) Family History High cholesterol(parent, other relative) Family History Hypertension(parent, other relative) Family History Stroke (parent) Family History Heart Disease (parent) sister has uncertain type of thyroid problem. father - Alz  a lot of thyroid problems in family   Review of Systems General:  Complains of fatigue and malaise; denies chills, fever, and loss of appetite. Eyes:  Denies blurring, eye irritation, and eye pain. ENT:  Complains of nosebleeds; denies postnasal  drainage, sinus pressure, and sore throat. CV:  Denies chest pain or discomfort, palpitations, shortness of breath with exertion, and swelling of feet. Resp:  Denies cough, shortness of breath, and wheezing. GI:  Denies abdominal pain, change in bowel habits, and indigestion. GU:  Denies dysuria and urinary frequency. MS:  Complains of stiffness; denies joint pain. Derm:  Denies itching, lesion(s), poor wound healing, and rash. Neuro:  Denies numbness and tingling. Psych:  Denies suicidal thoughts/plans; feels generally down  -- very fatigued . Endo:  Complains of cold intolerance; denies excessive thirst, excessive urination, and heat intolerance. Heme:  Denies abnormal bruising and bleeding.  Physical Exam  General:  fatigued but well appearing Head:  normocephalic, atraumatic, and no abnormalities observed.   Eyes:  vision grossly intact, pupils equal, pupils round, and pupils reactive to light.  no conjunctival pallor, injection or icterus  Ears:  R ear normal and L ear normal.   Mouth:  pharynx pink and moist.   Neck:  supple with full rom and no masses or thyromegally, no JVD or carotid bruit  Lungs:  Normal respiratory effort, chest expands symmetrically. Lungs are clear to auscultation, no crackles or wheezes. Heart:  Normal rate and regular rhythm. S1 and S2 normal without gallop, murmur, click, rub or other extra sounds. 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 Take 1 tablet by mouth  once a day  Problem # 4:  HYPERTENSION (ICD-401.9) Assessment: Unchanged  very well controlled on current meds  disc watching sodium in diet  no change in med  check up in 3 mo  Her updated medication list for this problem includes:    Chlorthalidone 25 Mg Tabs (Chlorthalidone) .Marland Kitchen... 1/2  tab by mouth daily    Losartan Potassium 100 Mg Tabs (Losartan potassium) .Marland Kitchen... Take 1 tablet by mouth once a day  BP today: 130/70 Prior BP: 118/68 (05/22/2009)  Labs Reviewed: K+: 3.4 (08/19/2009) Creat: : 1.0 (08/19/2009)   Chol: 250 (08/19/2009)   HDL: 70.30 (08/19/2009)   TG: 140.0 (08/19/2009)  Complete Medication List: 1)  Chlorthalidone 25 Mg Tabs (Chlorthalidone) .... 1/2  tab by mouth daily 2)  K Cl 20 Meq  .... Take 1 tablet by mouth once a day 3)  Simvastatin 80 Mg Tabs (Simvastatin) .... Take 1 tablet by mouth once a day 4)  Aspirin 81 Mg Tabs (Aspirin) .... Take 1 tablet by mouth once a day 5)  Multivitamins Tabs (Multiple vitamin) .... Take 1 tablet by mouth once a day 6)  Oscal 500/200 D-3 500-200 Mg-unit Tabs (Calcium-vitamin d) .... Take 2 tablet by mouth once a day 7)  Losartan Potassium 100 Mg Tabs (Losartan potassium) .... Take 1 tablet by mouth once a day  Patient Instructions: 1)  you can raise your HDL (good cholesterol) by increasing exercise and eating omega 3 fatty acid supplement like fish oil or flax seed oil over the counter 2)  you can lower LDL (bad cholesterol) by limiting saturated fats in diet like red meat, fried foods, egg yolks, fatty breakfast meats, high fat dairy products and shellfish  3)  when your thyroid is back in balance- I expect your cholesterol and energy level will improve  4)  your potassium is a little low- try to eat bananas and drink orange juice  5)  see Dr Evlyn Kanner for your thyroid treatment plan  6)  schedule appt  in 3 mo for 30 min check up and gyn exam  7)  the current recommendation for calcium intake is 1200-1500 mg daily with 1000 IU of  vitamin D  8)  we will set up bone density test at check up   Prior Medications (reviewed today): CHLORTHALIDONE 25 MG TABS (CHLORTHALIDONE) 1/2  tab by mouth daily K CL 20 MEQ () Take 1 tablet by mouth once a day SIMVASTATIN 80 MG TABS (SIMVASTATIN) Take 1 tablet by mouth once a day ASPIRIN 81 MG  TABS (ASPIRIN) Take 1 tablet by mouth once a day MULTIVITAMINS   TABS (MULTIPLE VITAMIN) Take 1 tablet by mouth once a day OSCAL 500/200 D-3 500-200 MG-UNIT TABS (CALCIUM-VITAMIN D) Take 2 tablet by mouth once a day LOSARTAN POTASSIUM 100 MG TABS (LOSARTAN POTASSIUM) Take 1 tablet by mouth once a day Current Allergies: ! PREDNISONE

## 2010-09-02 NOTE — Letter (Signed)
Summary: Results Follow up Letter  [facility]  [address] [place], [place] [address]   Phone: [phone]  Fax: [phone]    11/16/2009 MRN: [id number]    Gloria Parker [address] [place], [place]  [address]    Dear Ms. Mast,  The following are the results of your recent test(s):  Test         Result    Pap Smear:        Normal _____  Not Normal _____ Comments: ______________________________________________________ Cholesterol: LDL(Bad cholesterol):         Your goal is less than:         HDL (Good cholesterol):       Your goal is more than: Comments:  ______________________________________________________ Mammogram:        Normal _____  Not Normal _____ Comments:  ___________________________________________________________________ Hemoccult:        Normal _____  Not normal _______ Comments:    _____________________________________________________________________ Other Tests:   Bone Density Test:  Dexa scan shows normal bone density score. We will check it again in 2 years. The current recommendation for calcium intake is 1200-1500 mg daily with [phone] IU of vitamin D  We routinely do not discuss normal results over the telephone.  If you desire a copy of the results, or you have any questions about this information we can discuss them at your next office visit.   Sincerely,    Marne A. Milinda Antis, M.D.  MAT:lsf

## 2010-09-02 NOTE — Assessment & Plan Note (Signed)
Summary: ROA FOR SHINGLES/JRR   Vital Signs:  Patient profile:   70 year old female Height:      58.25 inches Weight:      121.75 pounds BMI:     25.32 Temp:     98.5 degrees F oral Pulse rate:   76 / minute Pulse rhythm:   regular BP sitting:   100 / 64  (left arm) Cuff size:   regular  Vitals Entered By: Lewanda Rife LPN (July 08, 2010 12:15 PM) CC: follow-up visit of shingles, Lipid Management   History of Present Illness: here for f/u of shingles with post herpetic neuralgia is on neurontin 300 two times a day and vicodin as needed  the rash is just about gone  still having pain in arm  gabapentin is helping a little -- is taking one instead of 2 per day  gets sleepy with it   she did hit her hand and had a lot of pain- that is improved now  pain is over arm and hand for the most part  is going to her grandaughters house next week  is expecting a baby as well  wanted to make sure she was not contagious     wt is down 11 lb  new thyroid Dr at [facility] is planning more surgery still has cancer from thyroid on " voicebox"== since iodine did not clear that -- so has to have more surgery on the end of the month   needs tsh today  Lipid Management History:      Positive NCEP/ATP III risk factors include female age 64 years old or older and hypertension.  Negative NCEP/ATP III risk factors include non-tobacco-user status.    Allergies: 1)  ! Prednisone  Past History:  Past Medical History: Last updated: 02/12/2010 lumbar deg disk - with back pain (s/p injections) Thyroid problem Vertigo 2007/2010 hearing loss- aide Hyperlipidemia Hypertension colon polyps breast cancer - caught very early -- bx was curative, then 55 radiology treatment  adenocarcinoma thyroid with thyroidectomy  GI-Dr Marina Goodell  ortho- Dr Priscille Kluver, Dr Alvester Morin (spine injection) surg - Dr Luan Pulling  ENT - [facility]- Dr Zachery Dakins  Past Surgical History: Last updated: 07/01/2009  1998 Breast  BX and radiation tx -- breast cancer  Bunion surgery nl dexa 6/06 R total thyroidectomy 11/10  Family History: Last updated: 08/21/2009 Family History of Arthritis (parent, other relative) Family History of Colon CA (other relative) Family History High cholesterol(parent, other relative) Family History Hypertension(parent, other relative) Family History Stroke (parent) Family History Heart Disease (parent) sister has uncertain type of thyroid problem. 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 Eyes:  Denies blurring and eye irritation. CV:  Denies chest pain or discomfort, lightheadness, and palpitations. Resp:  Denies cough, shortness of breath, and wheezing. GI:  Denies abdominal pain, change in bowel habits, and indigestion. GU:  Denies urinary frequency. MS:  Complains of joint pain; denies joint redness, joint swelling, and cramps. Derm:  Denies itching, lesion(s), poor wound healing, and rash. Neuro:  Denies numbness and tingling. Psych:  Denies anxiety and depression. Endo:  Denies cold intolerance, excessive thirst, excessive urination, and heat intolerance. Heme:  Denies abnormal bruising and bleeding.  Physical Exam  General:  fatigued but well appearing  hoarse voice is baseline Head:  normocephalic, atraumatic, and no abnormalities observed.   Eyes:  vision grossly intact, pupils equal, pupils round, and pupils reactive to light.   Mouth:  pharynx pink and moist.   Neck:  No deformities, masses, or tenderness noted. Chest Wall:  No deformities, masses,  or tenderness noted. Lungs:  Normal respiratory effort, chest expands symmetrically. Lungs are clear to auscultation, no crackles or wheezes. Heart:  Normal rate and regular rhythm. S1 and S2 normal without gallop, murmur, click, rub or other extra sounds. Abdomen:  Bowel sounds positive,abdomen soft and non-tender without masses, organomegaly or hernias noted. no renal bruits  Msk:  no acute joint changes  no tender cervical vertebrea  Pulses:  R and L carotid,radial,femoral,dorsalis pedis and posterior tibial pulses are full and equal bilaterally Extremities:  No clubbing, cyanosis, edema, or deformity noted with normal full range of motion of all joints.   Neurologic:  sensation intact to light touch, gait normal, and DTRs symmetrical and normal.   Skin:  erythematous vesiclar rash over lateral arm and palm -- in clusters resembling shingles no pustules rash is tender Cervical Nodes:  No lymphadenopathy noted Psych:  good attitude.. is fatigued but talkative and positive   Impression & Recommendations:  Problem # 1:  POSTHERPETIC NEURALGIA (ICD-053.19) Assessment Improved with post herpetic neuralgia will incr her gabapentin as tol to 600 two times a day and update vicodin as needed - with caution hopefully will improve with time given handout from [facility] on this condition  Problem # 2:  HYPOTHYROIDISM (ICD-244.9) Assessment: Unchanged  s/p thyrodectomy check tsh and free T4 and adj dose accordingly pt unsure if her new Dr at [facility] will take this over or not  has plan to remove last 3% of thyroid on throat -- in december  Her updated medication list for this problem includes:    Synthroid 50 Mcg Tabs (Levothyroxine sodium) .Marland Kitchen... 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 1/2  tab by mouth daily 2)  K Cl 20 Meq  .... Take 1 tablet by mouth once a day 3)  Simvastatin 80 Mg Tabs (Simvastatin) .... Take 1 tablet by mouth once a day 4)  Aspirin 81 Mg Tabs (Aspirin) .... Take 1 tablet by mouth once a day 5)  Multivitamins Tabs (Multiple vitamin) .... Take 1 tablet by mouth once a day 6)  Oscal 500/200 D-3 500-200 Mg-unit Tabs (Calcium-vitamin d) .... Take 2 tablet by mouth once a day 7)  Losartan Potassium 100 Mg Tabs (Losartan potassium) .... Take 1 tablet by mouth once a day 8)  Viibryd 40mg   .... Take 1/2 tablet by mouth daily or as directed 9)  Vicodin 5-500 Mg Tabs (Hydrocodone-acetaminophen) .Marland Kitchen.. 1-2 by mouth up to every 6 hours as needed severe pain watch for sedation 10)  Neurontin 300 Mg Caps (Gabapentin) .Marland KitchenMarland KitchenMarland Kitchen  2 by mouth daily for shingles pain  caution for sedation 11)  Synthroid 50 Mcg Tabs (Levothyroxine sodium) .Marland Kitchen.. 1 by mouth once daily  Other Orders: Prescription Created Electronically [phone])  Lipid Assessment/Plan:      Based on NCEP/ATP III, the patient's risk factor category is "2 or more risk factors and a calculated 10 year CAD risk of < 20%".  The patient's lipid goals are as follows: Total cholesterol goal is 200; LDL cholesterol goal is 130; HDL cholesterol goal is 40; Triglyceride goal is 150.     Patient Instructions: 1)  you can increase the neurontin (gabapentin ) to 2 pills twice daily for the pain from shingles  2)  only take the vicodin for breakthrough pain -- only take it if you really have to for severe pain 3)  checking your thyroid today then will update you with a plan Prescriptions: NEURONTIN 300 MG CAPS (GABAPENTIN) 2  by mouth daily for shingles pain  caution for sedation  #1 month x 5   Entered and Authorized by:   Judith Part MD   Signed by:   Judith Part MD on 07/08/2010   Method used:   [facility]        [facility]  [address] [phone]* (retail)       [address]       [place], [place]  [id number]       Ph: [phone] or [phone]       Fax: [phone]   RxID:   [id number]    Orders Added: 1)  Venipuncture [36415] 2)  TLB-TSH (Thyroid Stimulating Hormone) [84443-TSH] 3)  TLB-T4 (Thyrox), Free [95188-CZ6S] 4)  Prescription Created Electronically [[id number]] 5)  Est. Patient Level IV [06301]    Current Allergies (reviewed today): ! PREDNISONE

## 2010-09-02 NOTE — Progress Notes (Signed)
Summary: needs written scripts for mail order  Phone Note Refill Request Message from:  Patient  Refills Requested: Medication #1:  CHLORTHALIDONE 25 MG TABS 1/2  tab by mouth daily  Medication #2:  SIMVASTATIN 80 MG TABS Take 1 tablet by mouth once a day  Medication #3:  LOSARTAN POTASSIUM 100 MG TABS Take 1 tablet by mouth once a day Pt needs 90 written scripts to send to mail order pharmacy.  Please call her when  ready and she will pick up.  Initial call taken by: Lowella Petties CMA,  March 03, 2010 3:25 PM  Follow-up for Phone Call        printed in put in nurse in box for pickup  Follow-up by: Judith Part MD,  March 04, 2010 8:44 AM  Additional Follow-up for Phone Call Additional follow up Details #1::        Patient notified as instructed by telephone. Prescription left at front desk. Lewanda Rife LPN  March 04, 2010 11:10 AM     New/Updated Medications: CHLORTHALIDONE 25 MG TABS (CHLORTHALIDONE) 1/2  tab by mouth daily SIMVASTATIN 80 MG TABS (SIMVASTATIN) Take 1 tablet by mouth once a day LOSARTAN POTASSIUM 100 MG TABS (LOSARTAN POTASSIUM) Take 1 tablet by mouth once a day Prescriptions: LOSARTAN POTASSIUM 100 MG TABS (LOSARTAN POTASSIUM) Take 1 tablet by mouth once a day  #90 x 3   Entered and Authorized by:   Judith Part MD   Signed by:   Judith Part MD on 03/04/2010   Method used:   Print then Give to Patient   RxID:   [id number] SIMVASTATIN 80 MG TABS (SIMVASTATIN) Take 1 tablet by mouth once a day  #90 x 3   Entered and Authorized by:   Judith Part MD   Signed by:   Judith Part MD on 03/04/2010   Method used:   Print then Give to Patient   RxID:   [id number] CHLORTHALIDONE 25 MG TABS (CHLORTHALIDONE) 1/2  tab by mouth daily  #45 x 3   Entered and Authorized by:   Judith Part MD   Signed by:   Judith Part MD on 03/04/2010   Method used:   Print then Give to Patient   RxID:   [id number]

## 2010-09-02 NOTE — Letter (Signed)
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

## 2010-09-02 NOTE — Letter (Signed)
Summary: [facility]  [facility]   Imported By: Maryln Gottron 07/16/2010 14:37:10  _____________________________________________________________________  External Attachment:    Type:   Image     Comment:   External Document

## 2010-09-02 NOTE — Letter (Signed)
Summary: Meds/[facility]  Meds/[facility]   Imported By: Lanelle Bal 08/19/2010 08:13:03  _____________________________________________________________________  External Attachment:    Type:   Image     Comment:   External Document

## 2010-09-02 NOTE — Progress Notes (Signed)
Summary: needs refill on synthroid and update on pt's surgery  Phone Note Refill Request Call back at Home Phone [phone] Message from:  Patient  Refills Requested: Medication #1:  synthroid 50 mcg Pt has been getting this from Dr. Evlyn Kanner but she no longer sees him.  She will be seeing a new endo at [facility] but will soon be out of medicine.  She uses [facility] [address], uses brand name only.  She has appt to see you on 12/07.  Initial call taken by: Lowella Petties CMA, AAMA,  June 28, 2010 9:59 AM  Follow-up for Phone Call        px written on EMR for call in  Follow-up by: Judith Part MD,  June 28, 2010 11:37 AM  Additional Follow-up for Phone Call Additional follow up Details #1::        [facility] [address] [facility]. Name brand only called in. Left message for patient to call back. Lewanda Rife LPN  June 28, 2010 12:47 PM   Due to time of day left message on pt's v/m  [facility].Lewanda Rife LPN  June 28, 2010 4:34 PM       Additional Follow-up for Phone Call Additional follow up Details #2::    Patient notified as instructed by telephone. Pt wanted to let Dr Milinda Antis know that she saw Dr at [facility] on 06/29/10 and pt is scheduled for more surgery on 07/30/10( CA of voicebox). Pt said she will talk more with Dr Milinda Antis at her appt on 07/08/10 at 11:45am. Pt said her shingles are getting better also.Lewanda Rife LPN  June 29, 2010 9:42 AM   New/Updated Medications: SYNTHROID 50 MCG TABS (LEVOTHYROXINE SODIUM) 1 by mouth once daily Prescriptions: SYNTHROID 50 MCG TABS (LEVOTHYROXINE SODIUM) 1 by mouth once daily  #30 x 3   Entered and Authorized by:   Judith Part MD   Signed by:   Lewanda Rife LPN on 86/57/8469   Method used:   Telephoned to ...       [facility] (retail)       [address]       [place], [place]  [address]       Ph: [phone]       Fax: [phone]   RxID:   [id number]

## 2010-09-02 NOTE — Progress Notes (Signed)
Summary: dry skin from shingles  Phone Note Call from Patient Call back at Home Phone [phone]   Caller: Patient Call For: Gloria Part MD Summary of Call: Patient was here on 06-04-10 and was told that she had shingles. Patient says that it has gotten alot better, but sitll has alot of dry skin and it is itching. She is asking if there is something that she can use to help with the dryness and itching, either OTC or rx. Uses [facility] on [address] if needed.  Initial call taken by: Melody Comas,  June 15, 2010 2:02 PM  Follow-up for Phone Call        get some amylactin lotion - use several times daily and keep me updated ( I hope I spelled that right) it is a very potent moisturizing cream that used to be px and is now otc  Follow-up by: Gloria Part MD,  June 15, 2010 2:45 PM  Additional Follow-up for Phone Call Additional follow up Details #1::        Spoke with Marcelino Duster at [facility] on [address] and they do have the [employer]. It is OTC but pt should go to pharmacy register and they will give her the lotion. (it is not on the OTC shelves for the public to just pick up.) Patient notified as instructed by telephone. Pt will call back to update Dr Milinda Antis.Lewanda Rife LPN  June 15, 2010 3:35 PM

## 2010-09-02 NOTE — Letter (Signed)
Summary: Appts., Instructions, Medications/[facility]  Appts., Instructions, Medications/[facility]   Imported By: Maryln Gottron 08/20/2010 14:34:45  _____________________________________________________________________  External Attachment:    Type:   Image     Comment:   External Document

## 2010-09-02 NOTE — Letter (Signed)
Summary: [facility]  [facility]   Imported By: Lanelle Bal 04/28/2010 12:38:11  _____________________________________________________________________  External Attachment:    Type:   Image     Comment:   External Document

## 2010-09-02 NOTE — Assessment & Plan Note (Signed)
Summary: 2 MTH FU/CLE   Vital Signs:  Patient profile:   70 year old female Height:      58.25 inches Weight:      123.75 pounds BMI:     25.73 Temp:     98.4 degrees F oral Pulse rate:   84 / minute Pulse rhythm:   regular BP sitting:   140 / 76  (left arm) Cuff size:   regular  Vitals Entered By: Lewanda Rife LPN (April 16, 2010 3:45 PM) CC: two month f/u   History of Present Illness: here for f/u of HTN and lipids   wt is stablized now   thyroid update overall hoarseness drives her crazy - throat is occ sore  has a lot of phlegm in her throat and mouth  usually does not have allergies  is deciding on what kind of scan to have next  seeing Dr Evlyn Kanner and Dr Pollyann Kennedy (is watching her throat)  still has 3 % of her thyroid -- had iodine tx in january    depression on meds -- the medication is helping some -- gradually getting better  is going to group session for grief at [facility] and some individual counseling    HTN- bp 140/76 today  due for labs and K   lipids - due for check on statin diet was a bit worse also big change in thyroid  and this may affect cholesterol   is getting some exercise -- doing some exercises given to her  will be able to walk more with cooler weather   Allergies: 1)  ! Prednisone  Past History:  Past Medical History: Last updated: 02/12/2010 lumbar deg disk - with back pain (s/p injections) Thyroid problem Vertigo 2007/2010 hearing loss- aide Hyperlipidemia Hypertension colon polyps breast cancer - caught very early -- bx was curative, then 55 radiology treatment  adenocarcinoma thyroid with thyroidectomy  GI-Dr Marina Goodell  ortho- Dr Priscille Kluver, Dr Alvester Morin (spine injection) surg - Dr Luan Pulling  ENT - Pollyann Kennedy surg- Dr Zachery Dakins  Past Surgical History: Last updated: 07/01/2009  1998 Breast BX and radiation tx -- breast cancer  Bunion surgery nl dexa 6/06 R total thyroidectomy 11/10  Family History: Last updated:  08/21/2009 Family History of Arthritis (parent, other relative) Family History of Colon CA (other relative) Family History High cholesterol(parent, other relative) Family History Hypertension(parent, other relative) Family History Stroke (parent) Family History Heart Disease (parent) sister has uncertain type of thyroid problem. 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 Eyes:  Denies blurring and eye irritation. ENT:  Complains of hoarseness and sore throat. CV:  Denies chest pain or discomfort, palpitations, shortness of breath with exertion, and swelling of feet. Resp:  Denies cough, shortness of breath, and wheezing. GI:  Denies indigestion, nausea, and vomiting. GU:  Denies dysuria. MS:  Denies muscle aches and cramps. Derm:  Denies lesion(s), poor wound healing, and rash. Neuro:  Denies numbness, tingling, and tremors. Psych:  Complains of depression; denies panic attacks, sense of great danger, and suicidal thoughts/plans. Endo:  Denies excessive thirst and excessive urination. Heme:  Denies abnormal bruising and bleeding.  Physical Exam  General:  fatigued but well appearing wt loss noted  Head:  normocephalic, atraumatic, and no abnormalities observed.   Eyes:  vision grossly intact, pupils equal, pupils round, and pupils reactive to light.  no conjunctival pallor, injection or icterus  no exopthalmos Mouth:  pharynx pink and moist.   hoarse voice Neck:  supple with full rom and no masses or thyromegally, no JVD or carotid bruit  healed  thyroid scar  Lungs:  Normal respiratory effort, chest expands symmetrically. Lungs are clear to auscultation, no crackles or wheezes. Heart:  Normal rate and regular rhythm. S1 and S2 normal without gallop, murmur, click, rub or other extra sounds. Abdomen:  Bowel sounds positive,abdomen soft and non-tender without masses, organomegaly or hernias noted. no renal bruits  Msk:  No deformity or scoliosis noted of thoracic or lumbar no acute joint changes Pulses:  R and L carotid,radial,femoral,dorsalis pedis and posterior tibial pulses are full and equal bilaterally Extremities:  No clubbing, cyanosis, edema, or deformity noted with normal full range of motion of all joints.   Neurologic:  sensation intact to light touch, gait normal, and DTRs symmetrical and normal.   Skin:  Intact without suspicious lesions or rashes Cervical Nodes:  No lymphadenopathy noted Inguinal Nodes:  No significant adenopathy Psych:  affect a bit improved , still seems generally tired and down    Impression & Recommendations:  Problem # 1:  HYPERTENSION (ICD-401.9) Assessment Improved  doing well with current meds will get back to walking and healthier diet soon  lab today fu 6 months Her updated medication list for this problem includes:    Chlorthalidone 25 Mg Tabs (Chlorthalidone) .Marland Kitchen... 1/2  tab by mouth daily    Losartan Potassium 100 Mg Tabs (Losartan potassium) .Marland Kitchen... Take 1 tablet by mouth once a day  Orders: Venipuncture (04540) T-Comprehensive Metabolic Panel [phone]) T-Hepatic Function [id number]) T-Lipid Profile ([id number]) Specimen Handling (84132) Specimen Handling (44010)  BP today: 140/76 Prior BP: 146/84 (02/12/2010)  Labs Reviewed: K+: 3.4 (08/19/2009) Creat: : 1.0 (08/19/2009)   Chol: 250 (08/19/2009)   HDL: 70.30 (08/19/2009)   TG: 140.0 (08/19/2009)  Problem # 2:  HYPOTHYROIDISM (ICD-244.9) tx by Dr Evlyn Kanner- per pt is doing ok  for scans soon to monitor remaining #%thyroid  tx wit Iodine  Problem # 3:  HYPERLIPIDEMIA (ICD-272.4) Assessment: Unchanged  labs today chol may be up due to temporary worse diet also need to check this based on big thyroid change  rev low sat fat diet  continue statin  f/u 6  mo  Her updated medication list for this problem includes:    Simvastatin 80 Mg Tabs (Simvastatin) .Marland Kitchen... Take 1 tablet by mouth once a day  Orders: Venipuncture (27253) T-Comprehensive Metabolic Panel [phone]) T-Hepatic Function [phone]) T-Lipid Profile ([id number]) Specimen Handling (66063) Specimen Handling (01601)  Labs Reviewed: SGOT: 36 (08/19/2009)   SGPT: 49 (08/19/2009)   HDL:70.30 (08/19/2009)  Chol:250 (08/19/2009)  Trig:140.0 (08/19/2009)  Problem # 4:  GRIEF REACTION, ACUTE (ICD-309.0) Assessment: Improved pt is working through this - depression is helped by medication  Complete Medication List: 1)  Chlorthalidone 25 Mg Tabs (Chlorthalidone) .... 1/2  tab by mouth daily 2)  K Cl 20 Meq  .... Take 1 tablet by mouth once a day 3)  Simvastatin 80 Mg Tabs (Simvastatin) .... Take 1 tablet by mouth once a day 4)  Aspirin 81 Mg Tabs (Aspirin) .... Take 1 tablet by mouth once a day 5)  Multivitamins Tabs (Multiple vitamin) .... Take 1 tablet by mouth once a day 6)  Oscal 500/200 D-3 500-200 Mg-unit Tabs (Calcium-vitamin d) .... Take 2 tablet by mouth once a day 7)  Losartan Potassium 100 Mg Tabs (Losartan potassium) .... Take 1 tablet by mouth once a day 8)  Viibryd 40mg   .... Take 1/2 tablet by mouth daily  Other Orders: Flu Vaccine 65yrs + MEDICARE PATIENTS (J1914) Administration Flu vaccine - MCR (N8295)  Patient Instructions: 1)  you can try an over the counter antihistamine like zyrtec or claritin as directed to to see if it helps with phlegm in throat  2)  labs today 3)  get back to a better low fat diet  4)  get back to walking  5)  follow up with me in 6 months  6)  for calcium try a pill like oscal or caltrate -- you  can look at different brands   Current Allergies (reviewed today): ! PREDNISONE Prevention & Chronic Care Immunizations   Influenza vaccine: Fluvax 3+  (04/16/2010)    Tetanus booster: 10/12/2007: Td    Pneumococcal vaccine: Not documented    H. zoster vaccine: Not documented  Colorectal Screening   Hemoccult: Not documented    Colonoscopy: Location:  [facility].    (06/18/2008)   Colonoscopy due: 07/2013  Other Screening   Pap smear: Normal  (08/01/2006)    Mammogram: normal  (10/21/2008)    DXA bone density scan: normal  (10/30/2009)   Smoking status: never  (01/06/2009)  Lipids   Total Cholesterol: 250  (08/19/2009)   LDL: Not documented   LDL Direct: 161.1  (08/19/2009)   HDL: 70.30  (08/19/2009)   Triglycerides: 140.0  (08/19/2009)    SGOT (AST): 36  (08/19/2009)   SGPT (ALT): 49  (08/19/2009) CMP ordered    Alkaline phosphatase: 53  (08/19/2009)   Total bilirubin: 1.0  (08/19/2009)  Hypertension   Last Blood Pressure: 140 / 76  (04/16/2010)   Serum creatinine: 1.0  (08/19/2009)   Serum potassium 3.4  (08/19/2009) CMP ordered   Self-Management Support :    Hypertension self-management support: Not documented    Lipid self-management support: Not documented     Flu Vaccine Consent Questions     Do you have a history of severe allergic reactions to this vaccine? no    Any prior history of allergic reactions to egg and/or gelatin? no    Do you have a sensitivity to the preservative Thimersol? no    Do you have a past history of Guillan-Barre Syndrome? no    Do you currently have an acute febrile illness? no    Have you ever had a severe reaction to latex? no    Vaccine information given and explained to patient? yes    Are you currently pregnant? no    Lot Number:[id number]   Exp Date:01/29/2011   Site Given  Left Deltoid IMedflu   Lewanda Rife LPN  April 16, 2010 5:10 PM

## 2010-09-02 NOTE — Assessment & Plan Note (Signed)
Summary: ER follow up/ [place]/alc   Vital Signs:  Patient profile:   70 year old female Height:      58.25 inches Weight:      132.50 pounds BMI:     27.55 Temp:     98.7 degrees F oral Pulse rate:   80 / minute Pulse rhythm:   regular BP sitting:   128 / 68  (left arm) Cuff size:   regular  Vitals Entered By: Lewanda Rife LPN (June 04, 2010 12:43 PM) CC: Follow-up visit from [facility] ER visit and rash started on rt arm 06/03/10.   History of Present Illness: went to ER for R arm and chest pain  thought radiculopathy but now has a rash all the way down her arm   was given percocet - generic  is helping   rash broke out yesterday am  ? if possibly shingles pain goes from top of head all the way down neck/ arm  no known shingles exposures  has been run down     Allergies: 1)  ! Prednisone  Past History:  Past Medical History: Last updated: 02/12/2010 lumbar deg disk - with back pain (s/p injections) Thyroid problem Vertigo 2007/2010 hearing loss- aide Hyperlipidemia Hypertension colon polyps breast cancer - caught very early -- bx was curative, then 56 radiology treatment  adenocarcinoma thyroid with thyroidectomy  GI-Dr Marina Goodell  ortho- Dr Priscille Kluver, Dr Alvester Morin (spine injection) surg - Dr Luan Pulling  ENT - Pollyann Kennedy surg- Dr Zachery Dakins  Past Surgical History: Last updated: 07/01/2009  1998 Breast BX and radiation tx -- breast cancer  Bunion surgery nl dexa 6/06 R total thyroidectomy 11/10  Family History: Last updated: 08/21/2009 Family History of Arthritis (parent, other relative) Family History of Colon CA (other relative) Family History High cholesterol(parent, other relative) Family History Hypertension(parent, other relative) Family History Stroke (parent) Family History Heart Disease (parent) sister has uncertain type of thyroid problem. 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 Eyes:  Denies blurring, eye irritation, and eye pain. CV:  Denies chest pain or discomfort and palpitations. Resp:  Denies cough and wheezing. GI:  Denies abdominal pain, nausea, and vomiting. MS:  Denies joint swelling. Derm:  Complains of lesion(s) and rash. Neuro:  Complains of tingling; denies numbness. Endo:  Denies cold intolerance and heat intolerance.  Physical Exam  General:  fatigued but well appearing  Head:  normocephalic, atraumatic, and no abnormalities observed.  no rash on face whatsoever  Eyes:  vision grossly intact, pupils equal, pupils round, and pupils reactive to light.  no conjunctival pallor, injection or icterus  Mouth:  pharynx pink and moist.   Neck:  No deformities, masses, or tenderness noted. Lungs:  Normal respiratory effort, chest expands symmetrically. Lungs are clear to auscultation, no crackles or wheezes. Heart:  Normal rate and regular rhythm. S1 and S2 normal without gallop, murmur, click, rub or other extra sounds. Msk:  no acute joint changes  no tender cervical vertebrea  Extremities:  No clubbing, cyanosis, edema, or deformity noted with normal full range  of motion of all joints.   Neurologic:  strength normal in all extremities, sensation intact to light touch, gait normal, and DTRs symmetrical and normal.   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 1/2  tab by mouth daily 2)  K Cl 20 Meq  .... Take 1 tablet by mouth once a day 3)  Simvastatin 80 Mg Tabs (Simvastatin) .... Take 1 tablet by mouth once a day 4)  Aspirin 81 Mg Tabs (Aspirin) .... Take 1 tablet by mouth once a day 5)  Multivitamins Tabs (Multiple vitamin) .... Take 1 tablet by mouth once a day 6)  Oscal 500/200 D-3 500-200 Mg-unit Tabs (Calcium-vitamin d) .... Take 2 tablet by mouth once a day 7)  Losartan Potassium 100 Mg Tabs (Losartan potassium) .... Take 1 tablet by mouth once a day 8)  Viibryd 40mg   .... Take 1/2 tablet by mouth daily 9)  Valtrex 1 Gm Tabs (Valacyclovir hcl) .Marland Kitchen.. 1 by mouth three times a day for 7 days for shingles 10)  Percocet 5-325 Mg Tabs (Oxycodone-acetaminophen) .Marland Kitchen.. 1-2 by mouth up to every 4-6 hours as needed pain from shingles  Patient Instructions: 1)  take the valtrex as directed-= finish it all 2)  percocet for pain-- use caution this is sedating and can be habit forming - it can also cause constipation-- so stool softener is ok  3)  get rest  4)  update me if you do not start to improved in 7-10 days  5)  the rash and pain can last for quite a while- but hopefully the worst is over soon Prescriptions: PERCOCET 5-325 MG TABS (OXYCODONE-ACETAMINOPHEN) 1-2 by mouth up to every 4-6 hours as needed pain from shingles  #60 x 0   Entered and Authorized by:   Judith Part MD   Signed by:   Judith Part MD on 06/04/2010   Method used:   Print then Give to Patient   RxID:   [id number] VALTREX 1 GM TABS (VALACYCLOVIR HCL) 1  by mouth three times a day for 7 days for shingles  #21 x 0   Entered and Authorized by:   Judith Part MD   Signed by:   Judith Part MD on 06/04/2010   Method used:   Print then Give to Patient   RxID:   [id number]    Orders Added: 1)  Est. Patient Level III [84696]    Current Allergies (reviewed today): ! PREDNISONE

## 2010-09-03 NOTE — Letter (Signed)
Summary: [facility]  [facility]   Imported By: Lanelle Bal 09/01/2009 10:32:08  _____________________________________________________________________  External Attachment:    Type:   Image     Comment:   External Document

## 2010-09-14 ENCOUNTER — Encounter: Payer: Self-pay | Admitting: Family Medicine

## 2010-09-14 ENCOUNTER — Ambulatory Visit (INDEPENDENT_AMBULATORY_CARE_PROVIDER_SITE_OTHER): Payer: Medicare Other | Admitting: Family Medicine

## 2010-09-14 DIAGNOSIS — C73 Malignant neoplasm of thyroid gland: Secondary | ICD-10-CM

## 2010-09-14 DIAGNOSIS — M25519 Pain in unspecified shoulder: Secondary | ICD-10-CM

## 2010-09-14 DIAGNOSIS — B0229 Other postherpetic nervous system involvement: Secondary | ICD-10-CM

## 2010-09-14 DIAGNOSIS — E039 Hypothyroidism, unspecified: Secondary | ICD-10-CM

## 2010-09-16 NOTE — Letter (Signed)
Summary: [facility] Discharge Summary  [facility] Discharge Summary   Imported By: Kassie Mends 09/08/2010 09:33:44  _____________________________________________________________________  External Attachment:    Type:   Image     Comment:   External Document

## 2010-09-28 NOTE — Assessment & Plan Note (Signed)
Summary: Gloria Parker IS SORE   Vital Signs:  Patient profile:   70 year old female Height:      58.25 inches Weight:      105.75 pounds BMI:     21.99 Temp:     98 degrees F oral Pulse rate:   76 / minute Pulse rhythm:   regular BP sitting:   98 / 60  (left Gloria Parker) Cuff size:   regular  Vitals Entered By: Lewanda Rife LPN (September 14, 2010 4:18 PM) CC: Tingling in both hands and hard to make fist. Soreness in rt shoulder and hard to raise Gloria Parker up high. Comments Pt has had surgery and cannot talk yet. Pt's son [name] is with her today.   History of Present Illness: had recent radical neck dissection for pallilary thyroid cancer  did well overall  Dr Evlyn Kanner is followingher progress with thyroid her ca did go low in hosp and that was corrected had tube feeds initially- wt is down 16 lb now   saw Dr Evlyn Kanner on jan 31 and will see again at end of month   is on 150 micrograms of levothyroxine now   is able to eat - slowly     her hands are tingling and grip issues on the right  this was side with shingles some pain with raising her R Gloria Parker over her neck because it hurts  not immediately after surgery- just for past few weeks      Allergies: 1)  ! Prednisone  Past History:  Past Medical History: Last updated: 02/12/2010 lumbar deg disk - with back pain (s/p injections) Thyroid problem Vertigo 2007/2010 hearing loss- aide Hyperlipidemia Hypertension colon polyps breast cancer - caught very early -- bx was curative, then 55 radiology treatment  adenocarcinoma thyroid with thyroidectomy  GI-Dr Marina Goodell  ortho- Dr Priscille Kluver, Dr Alvester Morin (spine injection) surg - Dr Luan Pulling  ENT - [facility] surg- Dr Zachery Dakins  Past Surgical History: Last updated: 08/18/2010  1998 Breast BX and radiation tx -- breast cancer  Bunion surgery nl dexa 6/06 R total thyroidectomy 11/10 1/12 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  Family History: Last updated: 08/21/2009 Family  History of Arthritis (parent, other relative) Family History of Colon CA (other relative) Family History High cholesterol(parent, other relative) Family History Hypertension(parent, other relative) Family History Stroke (parent) Family History Heart Disease (parent) sister has uncertain type of thyroid problem. 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 Eyes:  Denies blurring and eye irritation. ENT:  Denies sore throat. CV:  Denies chest pain or discomfort, palpitations, shortness of breath with exertion, and swelling of feet. Resp:  Denies cough, shortness of breath, and wheezing. GI:  Denies abdominal pain, change in bowel habits, indigestion, and nausea. MS:  Denies joint redness and joint swelling. Derm:  Denies itching, lesion(s), poor wound healing, and rash. Neuro:  Complains of numbness and tingling; denies weakness. Psych:  mood is pretty good - positive outlook and attitude. Endo:  Denies cold intolerance, excessive thirst, excessive urination, and heat intolerance. Heme:  Denies abnormal bruising  and bleeding.  Physical Exam  General:  wt loss noted well appearing  unable to talk - son here to help with that  Head:  normocephalic, atraumatic, and no abnormalities observed.   Eyes:  vision grossly intact, pupils equal, pupils round, and pupils reactive to light.   Mouth:  pharynx pink and moist.   Neck:  post surgical changes noted tracheostomy site clean and healthy appearing   Chest Wall:  No deformities, masses, or tenderness  noted. Lungs:  Normal respiratory effort, chest expands symmetrically. Lungs are clear to auscultation, no crackles or wheezes. Heart:  Normal rate and regular rhythm. S1 and S2 normal without gallop, murmur, click, rub or other extra sounds. Msk:  R shoulder- mild acromion and bicep tendon tenderness pain to abd over 90 deg - passive or active  pos hawking test , pos neer tes   Pulses:  R and L carotid,radial,femoral,dorsalis pedis and posterior tibial pulses are full and equal bilaterally Extremities:  No clubbing, cyanosis, edema, or deformity noted with normal full range of motion of all joints.   Neurologic:  neg tinel /phalen in wrists symmetric grip Skin:  Intact without suspicious lesions or rashes Cervical Nodes:  No lymphadenopathy noted Psych:  normal affect, talkative and pleasant  quite cheerful supportive son present    Impression & Recommendations:  Problem # 1:  POSTHERPETIC NEURALGIA (ICD-053.19) Assessment Improved this is gradually improving - now with tingling in hand and wrist  neg tinel and phalen and good perfusion and grip today will continue to watch   Problem # 2:  SHOULDER PAIN, RIGHT (ICD-719.41) Assessment: New I suspect tendonitis as this did not start until 2 weeks ago (and issue is pain instead of strength) disc passive rom exercises to avoid frozen shoulder  may consider working with PT on this no nsaids for now in light of recent surgery  injection may be an option if not imp will disc with her surgeon later this wk Her updated medication list for this problem includes:    Aspirin 81 Mg Tabs (Aspirin) .Marland Kitchen... Take 1 tablet by mouth once a day    Vicodin 5-500 Mg Tabs (Hydrocodone-acetaminophen) .Marland Kitchen... 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 1 by mouth once daily Her updated medication list for this problem includes:    Synthroid 150 Mcg Tabs (Levothyroxine sodium) .Marland Kitchen... Take 1 tablet by mouth once a day  Complete Medication List: 1)  Chlorthalidone 25 Mg Tabs (Chlorthalidone) .... 1/2  tab by mouth daily 2)  K Cl 20 Meq  .... Take 1 tablet by mouth once a day 3)  Simvastatin 80 Mg Tabs (Simvastatin) .... Take 1 tablet by mouth once a day 4)  Aspirin 81 Mg Tabs (Aspirin) .... Take 1 tablet by mouth once a day 5)  Multivitamins Tabs (Multiple vitamin) .... Take 1 tablet by mouth once a day  6)  Losartan Potassium 100 Mg Tabs (Losartan potassium) .... Take 1 tablet by mouth once a day 7)  Viibryd 40mg   .... Take 1 tablet by mouth daily or as directed 8)  Vicodin 5-500 Mg Tabs (Hydrocodone-acetaminophen) .Marland Kitchen.. 1-2 by mouth up to every 6 hours as needed severe pain watch for sedation 9)  Neurontin 300 Mg Caps (Gabapentin) .... 2 by mouth daily for shingles pain  caution for sedation 10)  Synthroid 150 Mcg Tabs (Levothyroxine sodium) .... Take 1 tablet by mouth once a day 11)  Calcium and Vitamin D 500/200mg   .... Takes 10 tablets daily 12)  Calcitriol 0.5 Mcg Caps (Calcitriol) .... Take 1 capsule by mouth once a day  Patient Instructions: 1)  think you are doing great ! 2)  I suspect that the shoulder pain is from bursitis/ tendonitis - but I do want you to discuss it with your surgeon too  3)  do the finger crawl on the wall exercise to prevent stiffness 4)  use ice and heat on shoulder 10 minutes on and 10 minutes off when  you can  5)  most likely the wrist tingling is from shingles- I think this will gradually improve with time  6)  when your thyroid labs are normal - call us to schedule labs for cholesterol - then we will advise further    Orders Added: 1)  Est. Patient Level IV [11914]    Current Allergies (reviewed today): ! PREDNISONE   Prevention & Chronic Care Immunizations   Influenza vaccine: Fluvax 3+  (04/16/2010)    Tetanus booster: 10/12/2007: Td    Pneumococcal vaccine: Not documented    H. zoster vaccine: Not documented  Colorectal Screening   Hemoccult: Not documented    Colonoscopy: Location:  [facility].    (06/18/2008)   Colonoscopy due: 07/2013  Other Screening   Pap smear: Normal  (08/01/2006)    Mammogram: normal  (10/21/2008)    DXA bone density scan: normal  (10/30/2009)   Smoking status: never  (01/06/2009)  Lipids   Total Cholesterol: 193  (04/17/2010)   LDL: 83  (04/17/2010)   LDL Direct: 161.1  (08/19/2009)   HDL: 58  (04/17/2010)   Triglycerides: 261  (04/17/2010)    SGOT (AST): 22  (04/17/2010)   SGPT (ALT): 20  (04/17/2010)   Alkaline phosphatase: 66  (04/17/2010)   Total bilirubin: 0.4  (04/17/2010)  Hypertension   Last Blood Pressure: 98 / 60  (09/14/2010)   Serum creatinine: 0.76  (04/17/2010)   Serum potassium 3.8  (04/17/2010)  Self-Management Support :    Hypertension self-management support: Not documented    Lipid self-management support: Not documented

## 2010-10-12 LAB — COMPREHENSIVE METABOLIC PANEL
ALT: 30 U/L (ref 0–35)
AST: 27 U/L (ref 0–37)
Alkaline Phosphatase: 59 U/L (ref 39–117)
BUN: 14 mg/dL (ref 6–23)
CO2: 27 mEq/L (ref 19–32)
Calcium: 9 mg/dL (ref 8.4–10.5)
Chloride: 102 mEq/L (ref 96–112)
Glucose, Bld: 123 mg/dL — ABNORMAL HIGH (ref 70–99)
Total Bilirubin: 0.9 mg/dL (ref 0.3–1.2)
Total Protein: 7 g/dL (ref 6.0–8.3)

## 2010-10-12 LAB — DIFFERENTIAL
Basophils Relative: 1 % (ref 0–1)
Eosinophils Absolute: 0.1 10*3/uL (ref 0.0–0.7)
Monocytes Absolute: 0.2 10*3/uL (ref 0.1–1.0)

## 2010-10-12 LAB — CBC
HCT: 41.3 % (ref 36.0–46.0)
MCHC: 34.1 g/dL (ref 30.0–36.0)
Platelets: 263 10*3/uL (ref 150–400)
RBC: 4.46 MIL/uL (ref 3.87–5.11)
WBC: 5.7 10*3/uL (ref 4.0–10.5)

## 2010-10-18 ENCOUNTER — Ambulatory Visit: Payer: Medicare Other | Admitting: Occupational Therapy

## 2010-10-18 ENCOUNTER — Ambulatory Visit: Payer: Medicare Other | Attending: Otolaryngology | Admitting: Speech Pathology

## 2010-10-18 DIAGNOSIS — R498 Other voice and resonance disorders: Secondary | ICD-10-CM | POA: Insufficient documentation

## 2010-10-18 DIAGNOSIS — M25619 Stiffness of unspecified shoulder, not elsewhere classified: Secondary | ICD-10-CM | POA: Insufficient documentation

## 2010-10-18 DIAGNOSIS — IMO0001 Reserved for inherently not codable concepts without codable children: Secondary | ICD-10-CM | POA: Insufficient documentation

## 2010-10-18 DIAGNOSIS — M6281 Muscle weakness (generalized): Secondary | ICD-10-CM | POA: Insufficient documentation

## 2010-10-18 DIAGNOSIS — M25519 Pain in unspecified shoulder: Secondary | ICD-10-CM | POA: Insufficient documentation

## 2010-10-25 ENCOUNTER — Ambulatory Visit: Payer: Medicare Other | Admitting: Occupational Therapy

## 2010-10-27 ENCOUNTER — Ambulatory Visit: Payer: Medicare Other | Admitting: Occupational Therapy

## 2010-10-27 ENCOUNTER — Ambulatory Visit: Payer: Medicare Other

## 2010-10-29 ENCOUNTER — Ambulatory Visit: Payer: Medicare Other | Attending: Otolaryngology

## 2010-10-29 DIAGNOSIS — R498 Other voice and resonance disorders: Secondary | ICD-10-CM | POA: Insufficient documentation

## 2010-10-29 DIAGNOSIS — M25619 Stiffness of unspecified shoulder, not elsewhere classified: Secondary | ICD-10-CM | POA: Insufficient documentation

## 2010-10-29 DIAGNOSIS — IMO0001 Reserved for inherently not codable concepts without codable children: Secondary | ICD-10-CM | POA: Insufficient documentation

## 2010-10-29 DIAGNOSIS — M25519 Pain in unspecified shoulder: Secondary | ICD-10-CM | POA: Insufficient documentation

## 2010-10-29 DIAGNOSIS — M6281 Muscle weakness (generalized): Secondary | ICD-10-CM | POA: Insufficient documentation

## 2010-11-02 ENCOUNTER — Ambulatory Visit: Payer: Medicare Other | Admitting: Occupational Therapy

## 2010-11-02 ENCOUNTER — Ambulatory Visit: Payer: Medicare Other | Attending: Otolaryngology

## 2010-11-02 DIAGNOSIS — IMO0001 Reserved for inherently not codable concepts without codable children: Secondary | ICD-10-CM | POA: Insufficient documentation

## 2010-11-02 DIAGNOSIS — M6281 Muscle weakness (generalized): Secondary | ICD-10-CM | POA: Insufficient documentation

## 2010-11-02 DIAGNOSIS — M25519 Pain in unspecified shoulder: Secondary | ICD-10-CM | POA: Insufficient documentation

## 2010-11-02 DIAGNOSIS — R4789 Other speech disturbances: Secondary | ICD-10-CM | POA: Insufficient documentation

## 2010-11-02 DIAGNOSIS — M25619 Stiffness of unspecified shoulder, not elsewhere classified: Secondary | ICD-10-CM | POA: Insufficient documentation

## 2010-11-02 LAB — CALCIUM: Calcium: 8.4 mg/dL (ref 8.4–10.5)

## 2010-11-03 ENCOUNTER — Encounter: Admitting: Occupational Therapy

## 2010-11-03 ENCOUNTER — Encounter: Admitting: Speech Pathology

## 2010-11-03 LAB — COMPREHENSIVE METABOLIC PANEL
Alkaline Phosphatase: 62 U/L (ref 39–117)
BUN: 13 mg/dL (ref 6–23)
CO2: 31 mEq/L (ref 19–32)
Chloride: 105 mEq/L (ref 96–112)
GFR calc non Af Amer: >60 mL/min (ref >60)
Glucose, Bld: 102 mg/dL — ABNORMAL HIGH (ref 70–99)
Potassium: 3.6 mEq/L (ref 3.5–5.1)
Total Bilirubin: 0.7 mg/dL (ref 0.3–1.2)

## 2010-11-03 LAB — DIFFERENTIAL
Basophils Absolute: 0 10*3/uL (ref 0.0–0.1)
Basophils Relative: 1 % (ref 0–1)
Monocytes Absolute: 0.4 10*3/uL (ref 0.1–1.0)
Neutro Abs: 3.4 10*3/uL (ref 1.7–7.7)
Neutrophils Relative %: 63 % (ref 43–77)

## 2010-11-03 LAB — CBC
HCT: 39.5 % (ref 36.0–46.0)
Hemoglobin: 13.7 g/dL (ref 12.0–15.0)
WBC: 5.4 10*3/uL (ref 4.0–10.5)

## 2010-11-08 ENCOUNTER — Ambulatory Visit: Payer: Medicare Other | Admitting: Speech Pathology

## 2010-11-08 ENCOUNTER — Ambulatory Visit: Payer: Medicare Other | Admitting: Occupational Therapy

## 2010-11-10 ENCOUNTER — Ambulatory Visit: Payer: Medicare Other | Admitting: Occupational Therapy

## 2010-11-10 ENCOUNTER — Ambulatory Visit: Payer: Medicare Other

## 2010-11-11 ENCOUNTER — Encounter: Admitting: Occupational Therapy

## 2010-11-11 ENCOUNTER — Encounter

## 2010-11-15 ENCOUNTER — Ambulatory Visit: Payer: Medicare Other

## 2010-11-15 ENCOUNTER — Ambulatory Visit: Payer: Medicare Other | Admitting: Occupational Therapy

## 2010-11-17 ENCOUNTER — Encounter: Admitting: Occupational Therapy

## 2010-11-17 ENCOUNTER — Ambulatory Visit: Payer: Medicare Other

## 2010-11-17 ENCOUNTER — Other Ambulatory Visit: Payer: Self-pay | Admitting: Family Medicine

## 2010-11-17 DIAGNOSIS — Z1231 Encounter for screening mammogram for malignant neoplasm of breast: Secondary | ICD-10-CM

## 2010-11-23 ENCOUNTER — Ambulatory Visit: Payer: Medicare Other

## 2010-11-24 ENCOUNTER — Ambulatory Visit
Admission: RE | Admit: 2010-11-24 | Discharge: 2010-11-24 | Disposition: A | Discharge status: Home / Self Care | Payer: Medicare Other | Source: Ambulatory Visit | Attending: Family Medicine | Admitting: Family Medicine

## 2010-11-24 DIAGNOSIS — Z1231 Encounter for screening mammogram for malignant neoplasm of breast: Secondary | ICD-10-CM

## 2010-11-25 ENCOUNTER — Encounter

## 2010-11-25 ENCOUNTER — Other Ambulatory Visit: Payer: Self-pay | Admitting: Family Medicine

## 2010-11-25 DIAGNOSIS — R928 Other abnormal and inconclusive findings on diagnostic imaging of breast: Secondary | ICD-10-CM

## 2010-11-26 ENCOUNTER — Encounter

## 2010-11-30 ENCOUNTER — Ambulatory Visit: Payer: Medicare Other | Attending: Otolaryngology

## 2010-11-30 DIAGNOSIS — IMO0001 Reserved for inherently not codable concepts without codable children: Secondary | ICD-10-CM | POA: Insufficient documentation

## 2010-11-30 DIAGNOSIS — M6281 Muscle weakness (generalized): Secondary | ICD-10-CM | POA: Insufficient documentation

## 2010-11-30 DIAGNOSIS — M25519 Pain in unspecified shoulder: Secondary | ICD-10-CM | POA: Insufficient documentation

## 2010-11-30 DIAGNOSIS — R4789 Other speech disturbances: Secondary | ICD-10-CM | POA: Insufficient documentation

## 2010-11-30 DIAGNOSIS — M25619 Stiffness of unspecified shoulder, not elsewhere classified: Secondary | ICD-10-CM | POA: Insufficient documentation

## 2010-12-01 ENCOUNTER — Ambulatory Visit
Admission: RE | Admit: 2010-12-01 | Discharge: 2010-12-01 | Disposition: A | Discharge status: Home / Self Care | Source: Ambulatory Visit | Attending: Family Medicine | Admitting: Family Medicine

## 2010-12-01 ENCOUNTER — Ambulatory Visit
Admission: RE | Admit: 2010-12-01 | Discharge: 2010-12-01 | Disposition: A | Discharge status: Home / Self Care | Payer: Medicare Other | Source: Ambulatory Visit | Attending: Family Medicine | Admitting: Family Medicine

## 2010-12-01 DIAGNOSIS — R928 Other abnormal and inconclusive findings on diagnostic imaging of breast: Secondary | ICD-10-CM

## 2010-12-02 ENCOUNTER — Ambulatory Visit: Payer: Medicare Other

## 2010-12-07 ENCOUNTER — Ambulatory Visit: Payer: Medicare Other

## 2010-12-07 ENCOUNTER — Encounter: Payer: Self-pay | Admitting: *Deleted

## 2010-12-09 ENCOUNTER — Ambulatory Visit: Payer: Medicare Other

## 2010-12-14 ENCOUNTER — Ambulatory Visit: Payer: Medicare Other

## 2010-12-16 ENCOUNTER — Ambulatory Visit: Payer: Medicare Other

## 2010-12-17 NOTE — Op Note (Signed)
NAMEMarland Parker  BERNYCE, BRIMLEY NO.:  [id number]   MEDICAL RECORD NO.:  [id number]          PATIENT TYPE:  AMB   LOCATION:  ENDO                         FACILITY:  [facility]   PHYSICIAN:  Bernette Redbird, M.D.   DATE OF BIRTH:  1941-05-16   DATE OF PROCEDURE:  05/12/2005  DATE OF DISCHARGE:                                 OPERATIVE REPORT   PROCEDURE:  Colonoscopy with biopsy.   INDICATIONS:  A 70 year old [place] immigrant with family history of colon  cancer in her brother.   FINDINGS:  Small sessile polyp in the transverse colon removed by cold  biopsy.   DESCRIPTION OF PROCEDURE:  The nature, purpose, and risks of the procedure  were familiar to the patient from prior examinations and she provided  written consent. Sedation was fentanyl 50 mcg and Versed 5 mg IV without  arrhythmias or desaturation. The Olympus adult adjustable video colonoscope  was advanced to the terminal ileum with just a little bit of looping  overcome by external abdominal compression and pullback was then performed.  The terminal ileum had a normal appearance. The quality of prep in the colon  was excellent and it is felt that all areas are well seen.   On the way in, in the transverse colon at about 65 cm (as measured during  pullback), there was a sessile 4 mm polyp removed by 2 or 3 cold biopsies.  It appeared that excision was complete.   No other polyps were seen and there was no evidence of cancer, colitis,  vascular malformations or diverticulosis. Retroflexion in the rectum and  reinspection of the rectum were normal. The patient tolerated the procedure  well and there were no apparent complications.   IMPRESSION:  1.  Solitary diminutive colon polyp removed as described above (211.3).  2.  Family history of colon cancer in a first-degree relative.   PLAN:  Await pathology. Consider repeat colonoscopy in 3 years if the polyp  is adenomatous in character, otherwise 5 years.        ______________________________  Bernette Redbird, M.D.     RB/MEDQ  D:  05/12/2005  T:  05/12/2005  Job:  [id number]   cc:   Mosetta Putt, M.D.  Fax: [phone]

## 2010-12-17 NOTE — Procedures (Signed)
[place]  Patient:    Gloria Parker, Gloria Parker                   MRN: [id number] Proc. Date: 04/27/00 Adm. Date:  [id number] Attending:  Rich Brave CC:         Andres Ege, M.D.                           Procedure Report  PROCEDURE:  Colonoscopy with biopsies.  ENDOSCOPIST:  Florencia Reasons, M.D.  INDICATIONS:  Family history of colon cancer in a 70 year old [place] immigrant, who, five years ago, had colonoscopy which showed some hyperplastic polyps.  FINDINGS:  Two diminutive sessile polyps biopsied from the proximal colon.  INFORMED CONSENT:  The nature, purpose, and risks of the procedure were familiar to the patient from prior examination and she provided written consent.  SEDATION:  Fentanyl 40 mcg and Versed 4 mg IV without arrhythmias or desaturation.  DESCRIPTION OF PROCEDURE:  The Olympus adult video colonoscope was advanced with slight difficulty due to looping, which was overcome by having the patient in the supine position.  The cecum was reached without too much difficulty and pullback was then performed.  The ileocecal valve was clearly identified.  The quality of the prep was very good and it was felt that all areas were well seen.  There were two very small 1-2 mm hyperplastic appearing sessile polyps removed by cold biopsy technique, one in the ascending colon the other in the transverse colon.  No large polyps, cancer, colitis, vascular malformations or diverticular disease were observed and retroflexion to the rectum was unremarkable.  The patient did have moderate internal hemorrhoids, partially prolapsed at the start of the procedure, and also visible during pullout through the anal canal.  The patient tolerated the procedure well, and there were no apparent complications.  IMPRESSION: 1. Colon polyps removed as described above. 2. Family history of colon cancer.  PLAN:  Await pathology on  the polyps with anticipate colonoscopic follow up in 5 years. PLAN: DD:  04/27/00 TD:  04/27/00 Job: [id number]

## 2011-03-08 ENCOUNTER — Encounter: Payer: Self-pay | Admitting: Family Medicine

## 2011-03-09 ENCOUNTER — Ambulatory Visit (INDEPENDENT_AMBULATORY_CARE_PROVIDER_SITE_OTHER): Payer: Medicare Other | Admitting: Family Medicine

## 2011-03-09 ENCOUNTER — Encounter: Payer: Self-pay | Admitting: Family Medicine

## 2011-03-09 VITALS — BP 124/74 | HR 76 | Temp 98.3°F | Ht 58.25 in | Wt 103.5 lb

## 2011-03-09 DIAGNOSIS — E039 Hypothyroidism, unspecified: Secondary | ICD-10-CM

## 2011-03-09 DIAGNOSIS — Z8669 Personal history of other diseases of the nervous system and sense organs: Secondary | ICD-10-CM

## 2011-03-09 DIAGNOSIS — E785 Hyperlipidemia, unspecified: Secondary | ICD-10-CM

## 2011-03-09 DIAGNOSIS — R4589 Other symptoms and signs involving emotional state: Secondary | ICD-10-CM

## 2011-03-09 DIAGNOSIS — C73 Malignant neoplasm of thyroid gland: Secondary | ICD-10-CM

## 2011-03-09 DIAGNOSIS — E209 Hypoparathyroidism, unspecified: Secondary | ICD-10-CM

## 2011-03-09 MED ORDER — FLUTICASONE PROPIONATE 50 MCG/ACT NA SUSP: 2.0000 | Freq: Every day | NASAL | Status: DC

## 2011-03-09 NOTE — Assessment & Plan Note (Signed)
Reviewed pt's recent records and hx with her s/p laryngectomy for papillary thyroid cancer Overall doing well -will be getting some speech technology soon  ? LN on CT did not light up on PET scan so surgeon will check it again with [place] in the fall  Dr Evlyn Kanner handling her hypothyroid/ hypoparathyroid issues for now

## 2011-03-09 NOTE — Assessment & Plan Note (Signed)
Currently oversupplemented and had dose cut by Dr Evlyn Kanner  Rev labs with pt  Sleep is an issue  Will see if antihistamine helps that

## 2011-03-09 NOTE — Progress Notes (Signed)
Subjective:    Patient ID: Gloria Parker, female    DOB: Jul 13, 1941, 70 y.o.   MRN: [id number]  HPI Here to disc recent thyroid hx and also dizziness (hx of intermittent vertigo)  Had neck dissection and laryngectomy for papillary thyroid cancer  ? Of pos R neck node Scheduled a PET scan  She followed up with surgeon on Monday-- some spots were seen that were so small -- inconclusive Thyroglobulin test is negligable also  LN did not light up on PET scan Has [place] in October and then follow up  Just watching it -had just gotten better from a cold   Dr Evlyn Kanner is endo Last labs ca nl / phos a bit high tsh low - ? Dose change - did cut it in half  Vit D good at 70- is on D  Lipids LDL is 160- changed from zocor to lipitor (was off med for a while)   On an antidepressant  Will be meeting with speech pathologist -- will have a procedure to get voice technology  Is having more vertigo  Happened after runny nose and fever  Also allergy med - benadryl - occas -- has allergies too   Nose runs and congested but no more fever   No meclizine or dramamine   Hyper and cannot sleep - this is likely from thyroid and over supplementation Now is improving   Patient Active Problem List  Diagnoses  . ADENOCARCINOMA, THYROID GLAND  . HYPOTHYROIDISM  . HYPERLIPIDEMIA  . STRESS REACTION, ACUTE, WITH EMOTIONAL DISTURBANCE  . GRIEF REACTION, ACUTE  . TREMOR, ESSENTIAL, RIGHT HAND  . HYPERTENSION  . DEGENERATIVE DISC DISEASE, LUMBAR SPINE  . VERTIGO, HX OF  . POSTMENOPAUSAL STATUS  . POSTHERPETIC NEURALGIA  . SHOULDER PAIN, RIGHT  . Hypoparathyroidism   Past Medical History  Diagnosis Date  . Degenerative disk disease     lumbar- with back pain (s/p injections)   . Vertigo 2007-2010  . Hearing loss     aide  . Hyperlipidemia   . Hypertension   . Colon polyps   . Breast cancer     caught very early- bx was curative, then 84 radiology treatments   . Adenocarcinoma     thyroid  with thyroidectomy   . Hypothyroid     after thyroidectomy (Dr Evlyn Kanner)  . Hypoparathyroidism     surgical    Past Surgical History  Procedure Date  . Breast biopsy 1998    breast cancer   . Thyroidectomy 11/10    last part of thryoid/neck disection and laryngectomy 1/12  . Laryngectomy     for thyroid cancer   History  Substance Use Topics  . Smoking status: Never Smoker   . Smokeless tobacco: Not on file  . Alcohol Use: No   Family History  Problem Relation Age of Onset  . Alzheimer's disease Father    Allergies  Allergen Reactions  . Prednisone     REACTION: facial swelling   Current Outpatient Prescriptions on File Prior to Visit  Medication Sig Dispense Refill  . calcitRIOL (ROCALTROL) 0.5 MCG capsule Take 0.5 mcg by mouth daily.        . calcium-vitamin D (OSCAL WITH D) 500-200 MG-UNIT per tablet Take 10 tablets daily       . levothyroxine (SYNTHROID) 150 MCG tablet Take 150 mcg by mouth daily. Takes Mon thru Sat daily but does not take on Sunday.      Marland Kitchen aspirin 81 MG tablet Take  81 mg by mouth daily.        . chlorthalidone (HYGROTON) 25 MG tablet 1/2 tablet by mouth daily       . gabapentin (NEURONTIN) 300 MG capsule 300 mg. Take 2 by mouth daily for shingles pain: Caution for sedation       . HYDROcodone-acetaminophen (VICODIN) 5-500 MG per tablet 1 tablet. Take 1 - 2 by mouth up to every 6 hrs as needed for severe pain: watch for sedation       . losartan (COZAAR) 100 MG tablet Take 100 mg by mouth daily.        . Multiple Vitamins-Iron (MULTIVITAMIN/IRON) TABS Take by mouth daily.        . simvastatin (ZOCOR) 80 MG tablet Take 80 mg by mouth daily.             Review of Systems Review of Systems  Constitutional: Negative for fever, appetite change,  and unexpected weight change. pos for some fatigue  Eyes: Negative for pain and visual disturbance.  Respiratory: Negative for cough and shortness of breath.   Cardiovascular: Negative.  for cp or sob    Gastrointestinal: Negative for nausea, diarrhea and constipation.  Genitourinary: Negative for urgency and frequency.  Skin: Negative for pallor or rash or dryness .  Neurological: Negative for weakness, , numbness and headaches. pos for dizziness/ vertigo Hematological: Negative for adenopathy. Does not bruise/bleed easily.  Psychiatric/Behavioral: Negative for dysphoric mood. The patient is not nervous/anxious.         Objective:   Physical Exam  Constitutional: She appears well-developed and well-nourished. No distress.  HENT:  Head: Normocephalic and atraumatic.  Right Ear: External ear normal.  Left Ear: External ear normal.       MMM  Still cannot talk Trache plug in place  Eyes: Conjunctivae and EOM are normal. Pupils are equal, round, and reactive to light.  Neck: Normal range of motion. Neck supple. No JVD present. Carotid bruit is not present. Erythema present. No thyromegaly present.  Cardiovascular: Normal rate, regular rhythm, normal heart sounds and intact distal pulses.   No murmur heard. Pulmonary/Chest: Effort normal and breath sounds normal. No respiratory distress. She has no wheezes. She has no rales.  Abdominal: Bowel sounds are normal. She exhibits no distension and no mass. There is no tenderness.  Musculoskeletal: Normal range of motion. She exhibits no edema and no tenderness.  Lymphadenopathy:    She has no cervical adenopathy.  Neurological: She is alert. She has normal strength and normal reflexes. She displays no tremor. No cranial nerve deficit or sensory deficit. She exhibits normal muscle tone. She displays a negative Romberg sign. Coordination and gait normal.       1-2 beats of horizontal nystagmus bilat  Skin: Skin is warm and dry. No rash noted. No erythema. No pallor.  Psychiatric: She has a normal mood and affect.          Assessment & Plan:

## 2011-03-09 NOTE — Assessment & Plan Note (Signed)
Doing much better overall Off antidepressant and better attitude Will continue to follow

## 2011-03-09 NOTE — Assessment & Plan Note (Signed)
Worse s/p viral uri and also with allergies Trial of flonase and also zyrtec (may also help sleep) Will update if not imp

## 2011-03-09 NOTE — Assessment & Plan Note (Signed)
Is followed by dr Evlyn Kanner Ca was nl and phos slt high this check- rev with pt  Vit D good on current supplement She will talk to him about bone density testing when appropriate or we can address that here

## 2011-03-09 NOTE — Assessment & Plan Note (Signed)
Now on lipitor and doing well Working on thyroid titration as well  Will check lab in 4 wk Last LDL at Dr Rinaldo Cloud office was 160

## 2011-03-09 NOTE — Patient Instructions (Addendum)
Get zyrtec 10 mg one pill daily in evening - for allergies/ dizziness/ and to help sleep Start flonase as directed for allergies/ congestion and dizziness Take these during allergy season  Schedule fasting labs in 4 weeks for cholesterol  Update me if dizziness worsens or does not improve

## 2011-04-06 ENCOUNTER — Other Ambulatory Visit (INDEPENDENT_AMBULATORY_CARE_PROVIDER_SITE_OTHER): Payer: Medicare Other

## 2011-04-06 DIAGNOSIS — E785 Hyperlipidemia, unspecified: Secondary | ICD-10-CM

## 2011-04-06 LAB — LIPID PANEL
Cholesterol: 106 mg/dL (ref 0–200)
HDL: 43 mg/dL (ref >39.00)
Triglycerides: 57 mg/dL (ref 0.0–149.0)
VLDL: 11.4 mg/dL (ref 0.0–40.0)

## 2011-09-09 ENCOUNTER — Ambulatory Visit (INDEPENDENT_AMBULATORY_CARE_PROVIDER_SITE_OTHER): Payer: Medicare Other | Admitting: Family Medicine

## 2011-09-09 ENCOUNTER — Encounter: Payer: Self-pay | Admitting: Family Medicine

## 2011-09-09 VITALS — BP 160/80 | HR 88 | Temp 98.6°F | Ht 58.25 in | Wt 104.2 lb

## 2011-09-09 DIAGNOSIS — I10 Essential (primary) hypertension: Secondary | ICD-10-CM

## 2011-09-09 DIAGNOSIS — E785 Hyperlipidemia, unspecified: Secondary | ICD-10-CM

## 2011-09-09 DIAGNOSIS — F32A Depression, unspecified: Secondary | ICD-10-CM

## 2011-09-09 DIAGNOSIS — F329 Major depressive disorder, single episode, unspecified: Secondary | ICD-10-CM

## 2011-09-09 MED ORDER — LOSARTAN POTASSIUM 100 MG PO TABS: 100.0000 mg | ORAL_TABLET | Freq: Every day | ORAL | Status: DC

## 2011-09-09 NOTE — Assessment & Plan Note (Signed)
Will check lipids before next appt Pt off med at this time- watching diet Will make plan after we see results

## 2011-09-09 NOTE — Assessment & Plan Note (Signed)
Pt having more problems with depression and grief  Has had own thyroid ca / multiple surgeries and loss of husband Ref to counseling  She may need to communicate on paper - since cannot talk with current device when emotional

## 2011-09-09 NOTE — Progress Notes (Signed)
Subjective:    Patient ID: Gloria Parker, female    DOB: 23-May-1941, 71 y.o.   MRN: [id number]  HPI Here for f/u of HTN and lipids  Laryngectomy for thyroid cancer - had surgery to help restore voice - has trache with plug in it  Able to talk in a limited way- but when she is emotional or crying is difficult- so today she has to write on a pc of paper  bp high - and needs to get back on medicine  No headache or palpitations or edema or other symptoms   Is feeling so /so   Still tired   Is depressed too  Is interested in a female counselor  Is very sad   Hard to take care of herself feeling like this Also grief from losing husband  Patient Active Problem List  Diagnoses  . ADENOCARCINOMA, THYROID GLAND  . HYPOTHYROIDISM  . HYPERLIPIDEMIA  . STRESS REACTION, ACUTE, WITH EMOTIONAL DISTURBANCE  . GRIEF REACTION, ACUTE  . TREMOR, ESSENTIAL, RIGHT HAND  . HYPERTENSION  . DEGENERATIVE DISC DISEASE, LUMBAR SPINE  . VERTIGO, HX OF  . POSTMENOPAUSAL STATUS  . POSTHERPETIC NEURALGIA  . SHOULDER PAIN, RIGHT  . Hypoparathyroidism  . Depression (emotion)   Past Medical History  Diagnosis Date  . Degenerative disk disease     lumbar- with back pain (s/p injections)   . Vertigo 2007-2010  . Hearing loss     aide  . Hyperlipidemia   . Hypertension   . Colon polyps   . Breast cancer     caught very early- bx was curative, then 68 radiology treatments   . Adenocarcinoma     thyroid with thyroidectomy   . Hypothyroid     after thyroidectomy (Dr Evlyn Kanner)  . Hypoparathyroidism     surgical    Past Surgical History  Procedure Date  . Breast biopsy 1998    breast cancer   . Thyroidectomy 11/10    last part of thryoid/neck disection and laryngectomy 1/12  . Laryngectomy     for thyroid cancer   History  Substance Use Topics  . Smoking status: Never Smoker   . Smokeless tobacco: Not on file  . Alcohol Use: No   Family History  Problem Relation Age of Onset  .  Alzheimer's disease Father    Allergies  Allergen Reactions  . Prednisone     REACTION: facial swelling   Current Outpatient Prescriptions on File Prior to Visit  Medication Sig Dispense Refill  . calcitRIOL (ROCALTROL) 0.5 MCG capsule Take 0.5 mcg by mouth daily.        . calcium-vitamin D (OSCAL WITH D) 500-200 MG-UNIT per tablet Take 10 tablets daily       . levothyroxine (SYNTHROID) 150 MCG tablet Take 150 mcg by mouth daily. Takes Mon thru Sat daily but does not take on Sunday.      Marland Kitchen aspirin 81 MG tablet Take 81 mg by mouth daily.        Marland Kitchen atorvastatin (LIPITOR) 80 MG tablet Take 80 mg by mouth daily.        . chlorthalidone (HYGROTON) 25 MG tablet 1/2 tablet by mouth daily       . fluticasone (FLONASE) 50 MCG/ACT nasal spray Place 2 sprays into the nose daily.  16 g  11  . gabapentin (NEURONTIN) 300 MG capsule 300 mg. Take 2 by mouth daily for shingles pain: Caution for sedation       . HYDROcodone-acetaminophen (VICODIN)  5-500 MG per tablet 1 tablet. Take 1 - 2 by mouth up to every 6 hrs as needed for severe pain: watch for sedation       . Multiple Vitamins-Iron (MULTIVITAMIN/IRON) TABS Take by mouth daily.        . simvastatin (ZOCOR) 80 MG tablet Take 80 mg by mouth daily.            Review of Systems Review of Systems  Constitutional: Negative for fever,  and unexpected weight change. pos for poor appetite and fatigue  Eyes: Negative for pain and visual disturbance.  Respiratory: Negative for cough and shortness of breath.   Cardiovascular: Negative for cp or palpitations    Gastrointestinal: Negative for nausea, diarrhea and constipation.  Genitourinary: Negative for urgency and frequency.  Skin: Negative for pallor or rash   Neurological: Negative for weakness, light-headedness, numbness and headaches.  Hematological: Negative for adenopathy. Does not bruise/bleed easily.  Psychiatric/Behavioral: pos for depression and anxiety/ no SI- has good support.            Objective:   Physical Exam  Constitutional: She appears well-developed and well-nourished. No distress.       Emotionally labile Unable to speak well - has to write down statements   HENT:  Head: Normocephalic and atraumatic.  Mouth/Throat: Oropharynx is clear and moist.       Trache with plug intact and site is not inflammed  Eyes: Conjunctivae and EOM are normal. Pupils are equal, round, and reactive to light. No scleral icterus.  Neck: Normal range of motion. Neck supple. No JVD present. Carotid bruit is not present. No thyromegaly present.  Cardiovascular: Normal rate, regular rhythm, normal heart sounds and intact distal pulses.  Exam reveals no gallop.   Pulmonary/Chest: Effort normal and breath sounds normal. No respiratory distress. She has no wheezes.  Abdominal: Soft. Bowel sounds are normal. She exhibits no distension and no abdominal bruit. There is no tenderness.  Musculoskeletal: She exhibits no edema.  Lymphadenopathy:    She has no cervical adenopathy.  Neurological: She is alert. She has normal reflexes. No cranial nerve deficit. She exhibits normal muscle tone. Coordination normal.  Skin: Skin is dry. No rash noted. No erythema. No pallor.  Psychiatric: Her behavior is normal. Her mood appears anxious. Her affect is labile. Her affect is not blunt and not inappropriate. Cognition and memory are normal. She exhibits a depressed mood.       Pt is crying at intervals - but consolable and pleasant, answers questions appropriately          Assessment & Plan:

## 2011-09-09 NOTE — Patient Instructions (Signed)
Get back on cozaar  Update me if any problems- I sent it to your pharmacy  I will see you in June for your physical  We will do the referral to counseling at check out

## 2011-09-09 NOTE — Assessment & Plan Note (Signed)
Pt stopped much of her medication with grief/ depression and recent surgery  Will start back on cozaar - she is not yet ready for the rest  Will f/u summer as planned and in the mean time refer to a counselor

## 2011-09-20 ENCOUNTER — Ambulatory Visit (INDEPENDENT_AMBULATORY_CARE_PROVIDER_SITE_OTHER): Payer: Medicare Other | Admitting: Psychology

## 2011-09-20 DIAGNOSIS — F331 Major depressive disorder, recurrent, moderate: Secondary | ICD-10-CM

## 2011-09-20 IMAGING — US US SOFT TISSUE HEAD/NECK
1 series · 14 of 25 positions shown · non-contrast
Comparison: None

CLINICAL DATA: Right thyroid nodule palpated on physical exam

THYROID ULTRASOUND
TECHNIQUE: Ultrasound examination of the thyroid gland and
adjacent soft tissues was performed.

[Series 1: us soft tissue head/neck · 0.07mm/px · 14 of 27 slices shown]
[im 1/27]
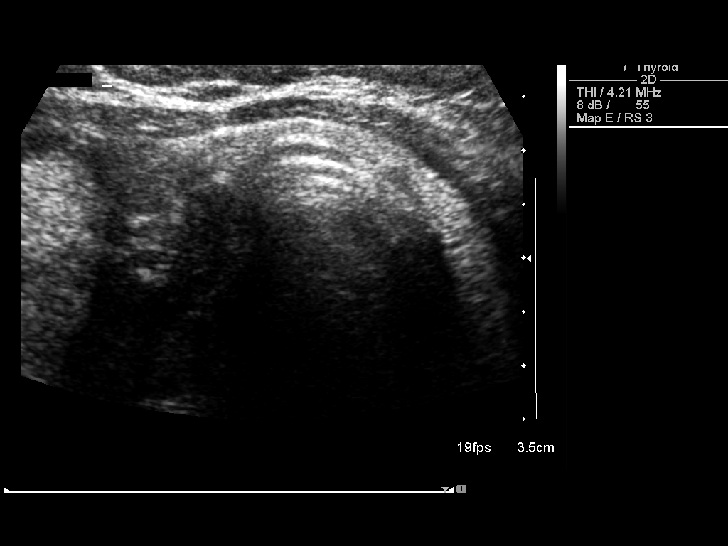
[im 3/27]
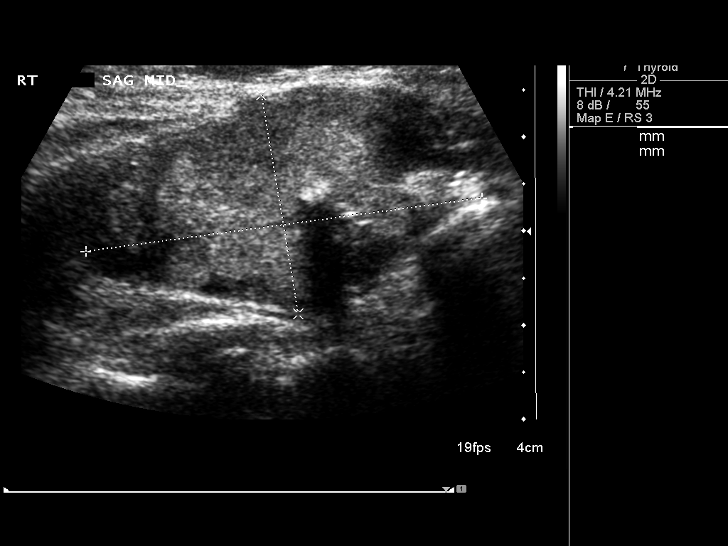
[im 5/27]
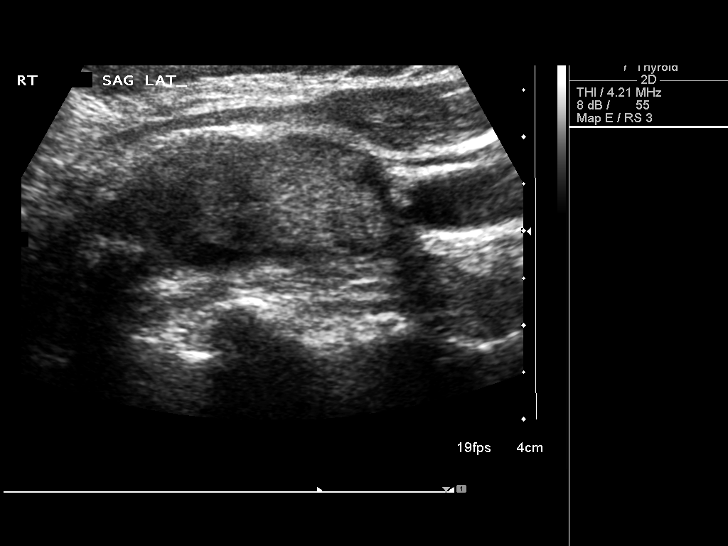
[im 7/27]
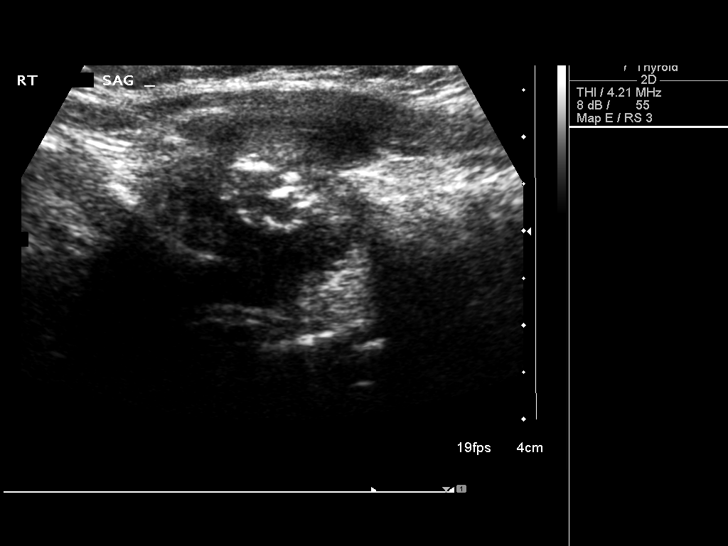
[im 9/27]
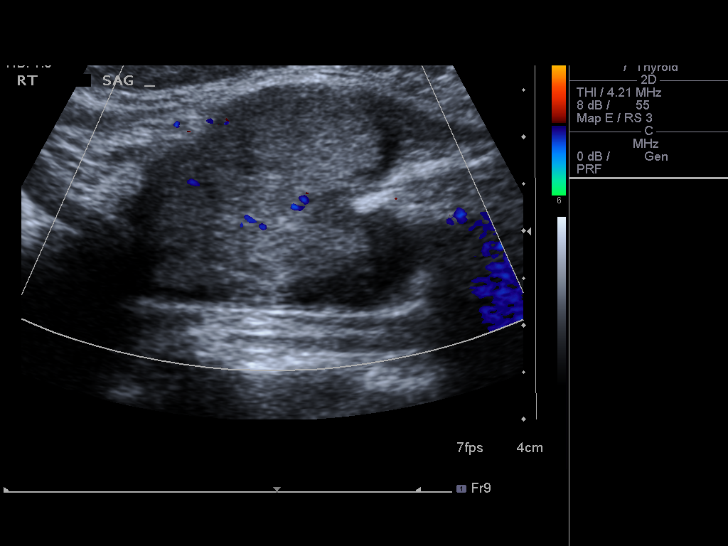
[im 10/27]
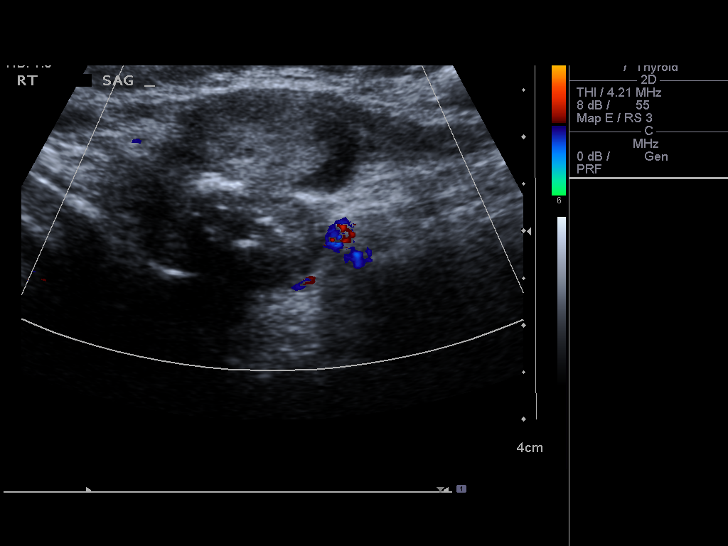
[im 12/27]
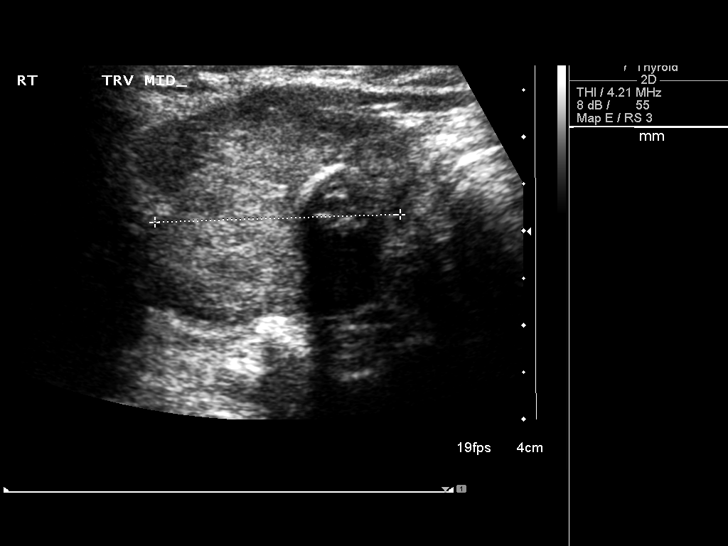
[im 15/27]
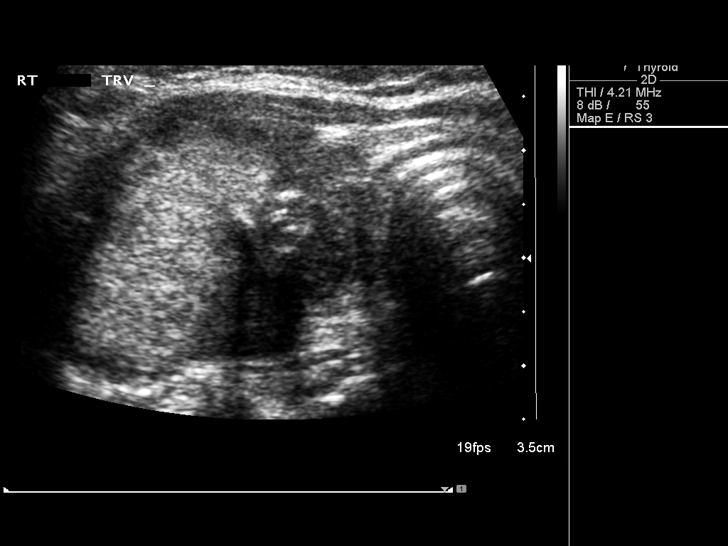
[im 17/27]
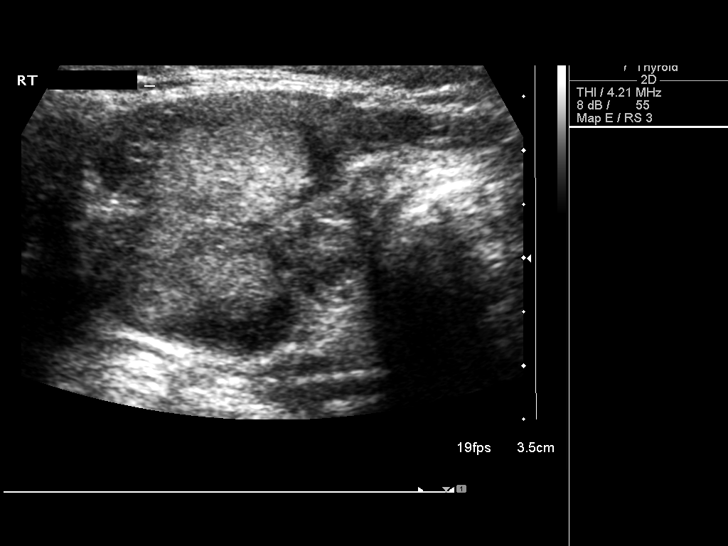
[im 18/27]
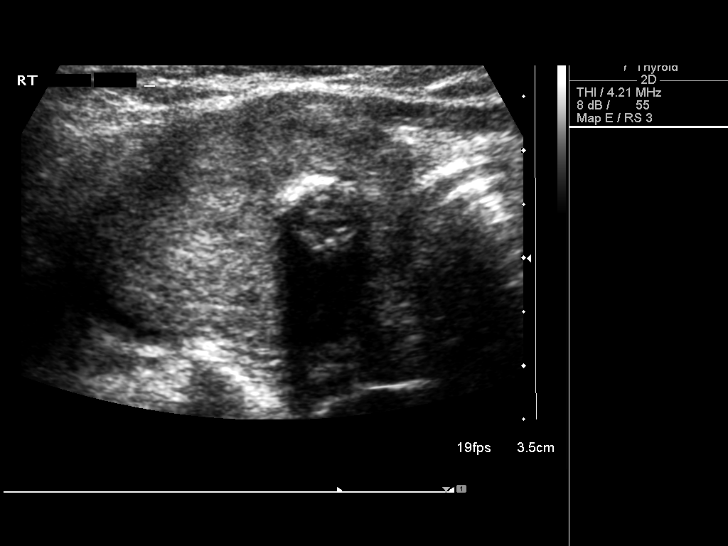
[im 20/27]
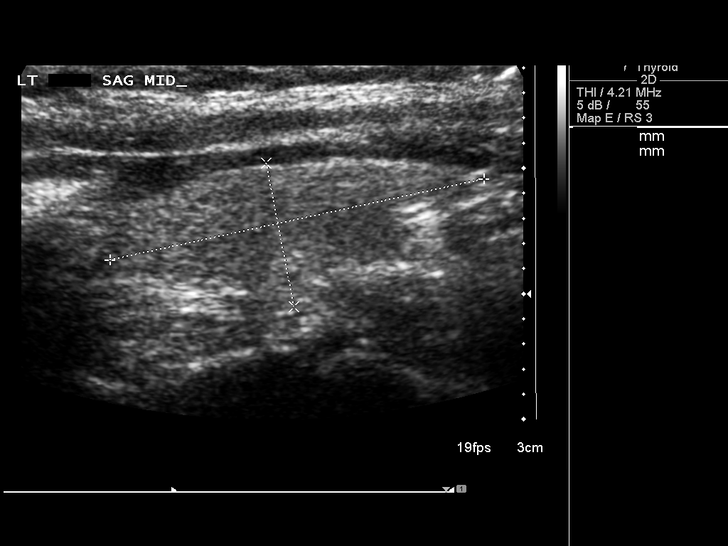
[im 22/27]
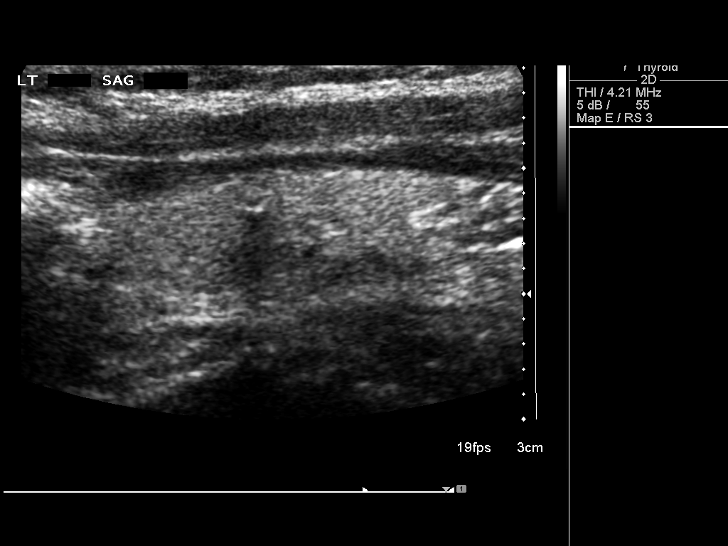
[im 24/27]
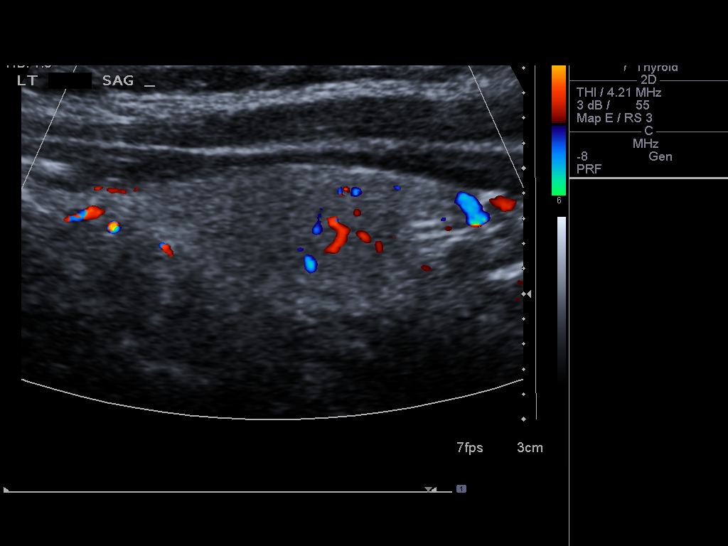
[im 27/27]
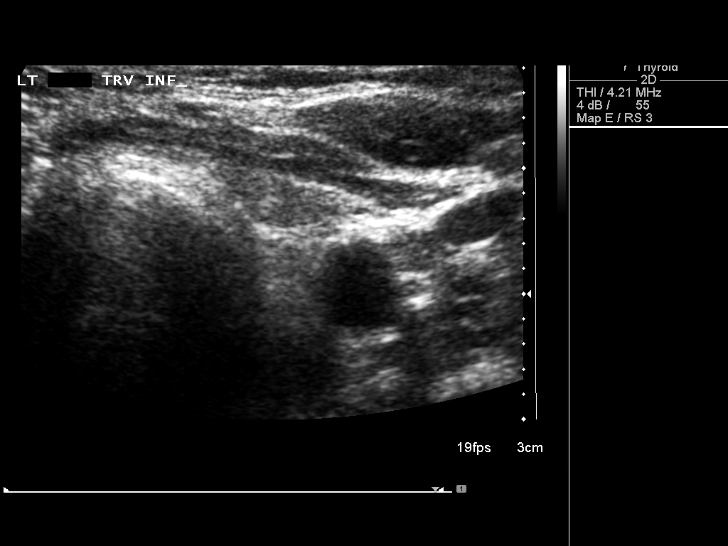

[14 of 25 positions shown; findings below may reference images not displayed]

FINDINGS: The right lobe of thyroid is enlarged , inhomogeneous,
and nodular measuring 4.2 x 2.4 x 2.6 cm.  There are calcifications
within this nodular right lobe but no discrete nodule is seen.

The left lobe is normal in size measuring 3.1 x 1.2 x 1.0 cm, with
the isthmus measuring 1.5 mm.  A few small calcifications are noted
in the left lobe as well.
IMPRESSION: Enlarged right lobe of thyroid which appears inhomogeneous and
nodular.  The left lobe is normal in size.

## 2011-09-28 ENCOUNTER — Ambulatory Visit (INDEPENDENT_AMBULATORY_CARE_PROVIDER_SITE_OTHER): Payer: Medicare Other | Admitting: Psychology

## 2011-09-28 DIAGNOSIS — F331 Major depressive disorder, recurrent, moderate: Secondary | ICD-10-CM

## 2011-10-12 ENCOUNTER — Ambulatory Visit (INDEPENDENT_AMBULATORY_CARE_PROVIDER_SITE_OTHER): Payer: Medicare Other | Admitting: Psychology

## 2011-10-12 DIAGNOSIS — F331 Major depressive disorder, recurrent, moderate: Secondary | ICD-10-CM

## 2011-11-02 ENCOUNTER — Ambulatory Visit (INDEPENDENT_AMBULATORY_CARE_PROVIDER_SITE_OTHER): Payer: Medicare Other | Admitting: Psychology

## 2011-11-02 DIAGNOSIS — F331 Major depressive disorder, recurrent, moderate: Secondary | ICD-10-CM

## 2011-12-14 ENCOUNTER — Ambulatory Visit: Payer: Medicare Other | Admitting: Psychology

## 2011-12-28 ENCOUNTER — Ambulatory Visit (INDEPENDENT_AMBULATORY_CARE_PROVIDER_SITE_OTHER): Payer: Medicare Other | Admitting: Psychology

## 2011-12-28 DIAGNOSIS — F331 Major depressive disorder, recurrent, moderate: Secondary | ICD-10-CM

## 2012-01-17 ENCOUNTER — Ambulatory Visit: Payer: Medicare Other | Admitting: Psychology

## 2012-01-18 ENCOUNTER — Encounter: Payer: Medicare Other | Admitting: Family Medicine

## 2012-01-25 ENCOUNTER — Ambulatory Visit (INDEPENDENT_AMBULATORY_CARE_PROVIDER_SITE_OTHER): Payer: Medicare Other | Admitting: Psychology

## 2012-01-25 DIAGNOSIS — F331 Major depressive disorder, recurrent, moderate: Secondary | ICD-10-CM

## 2012-02-01 ENCOUNTER — Ambulatory Visit: Payer: Medicare Other | Admitting: Psychology

## 2012-02-08 ENCOUNTER — Ambulatory Visit: Payer: Medicare Other | Admitting: Psychology

## 2012-02-14 ENCOUNTER — Other Ambulatory Visit (HOSPITAL_COMMUNITY): Payer: Self-pay | Admitting: Endocrinology

## 2012-02-14 DIAGNOSIS — C73 Malignant neoplasm of thyroid gland: Secondary | ICD-10-CM

## 2012-02-15 ENCOUNTER — Ambulatory Visit (INDEPENDENT_AMBULATORY_CARE_PROVIDER_SITE_OTHER): Payer: Medicare Other | Admitting: Psychology

## 2012-02-15 DIAGNOSIS — F331 Major depressive disorder, recurrent, moderate: Secondary | ICD-10-CM

## 2012-02-16 ENCOUNTER — Other Ambulatory Visit (HOSPITAL_COMMUNITY): Payer: Self-pay | Admitting: Endocrinology

## 2012-02-16 DIAGNOSIS — C73 Malignant neoplasm of thyroid gland: Secondary | ICD-10-CM

## 2012-02-17 ENCOUNTER — Encounter: Payer: Self-pay | Admitting: Family Medicine

## 2012-02-17 ENCOUNTER — Ambulatory Visit (INDEPENDENT_AMBULATORY_CARE_PROVIDER_SITE_OTHER): Payer: Medicare Other | Admitting: Family Medicine

## 2012-02-17 ENCOUNTER — Ambulatory Visit (HOSPITAL_COMMUNITY): Payer: Medicare Other

## 2012-02-17 ENCOUNTER — Encounter (HOSPITAL_COMMUNITY): Payer: Medicare Other

## 2012-02-17 VITALS — BP 110/68 | HR 69 | Temp 97.8°F | Ht 59.0 in | Wt 108.2 lb

## 2012-02-17 DIAGNOSIS — F329 Major depressive disorder, single episode, unspecified: Secondary | ICD-10-CM

## 2012-02-17 DIAGNOSIS — E039 Hypothyroidism, unspecified: Secondary | ICD-10-CM

## 2012-02-17 DIAGNOSIS — E209 Hypoparathyroidism, unspecified: Secondary | ICD-10-CM

## 2012-02-17 DIAGNOSIS — Z23 Encounter for immunization: Secondary | ICD-10-CM

## 2012-02-17 DIAGNOSIS — Z1231 Encounter for screening mammogram for malignant neoplasm of breast: Secondary | ICD-10-CM

## 2012-02-17 DIAGNOSIS — E785 Hyperlipidemia, unspecified: Secondary | ICD-10-CM

## 2012-02-17 DIAGNOSIS — C73 Malignant neoplasm of thyroid gland: Secondary | ICD-10-CM

## 2012-02-17 DIAGNOSIS — I10 Essential (primary) hypertension: Secondary | ICD-10-CM

## 2012-02-17 LAB — COMPREHENSIVE METABOLIC PANEL
Albumin: 4.2 g/dL (ref 3.5–5.2)
BUN: 16 mg/dL (ref 6–23)
CO2: 31 mEq/L (ref 19–32)
Calcium: 9.3 mg/dL (ref 8.4–10.5)
Chloride: 104 mEq/L (ref 96–112)
Creatinine, Ser: 0.8 mg/dL (ref 0.4–1.2)
GFR: 78.66 mL/min (ref >60.00)
Glucose, Bld: 100 mg/dL — ABNORMAL HIGH (ref 70–99)
Potassium: 4.3 mEq/L (ref 3.5–5.1)

## 2012-02-17 LAB — CBC WITH DIFFERENTIAL/PLATELET
Basophils Relative: 0.5 % (ref 0.0–3.0)
Eosinophils Relative: 2.1 % (ref 0.0–5.0)
Lymphocytes Relative: 26 % (ref 12.0–46.0)
MCV: 93.3 fl (ref 78.0–100.0)
Monocytes Absolute: 0.4 10*3/uL (ref 0.1–1.0)
Monocytes Relative: 6.5 % (ref 3.0–12.0)
Neutrophils Relative %: 64.9 % (ref 43.0–77.0)
Platelets: 289 10*3/uL (ref 150.0–400.0)
RBC: 4.4 Mil/uL (ref 3.87–5.11)
WBC: 5.7 10*3/uL (ref 4.5–10.5)

## 2012-02-17 LAB — LIPID PANEL
Cholesterol: 166 mg/dL (ref 0–200)
LDL Cholesterol: 78 mg/dL (ref 0–99)
Total CHOL/HDL Ratio: 2
Triglycerides: 82 mg/dL (ref 0.0–149.0)

## 2012-02-17 NOTE — Progress Notes (Signed)
Subjective:    Patient ID: Gloria Parker, female    DOB: 26-Oct-1940, 71 y.o.   MRN: [id number]  HPI Here for check up of chronic medical conditions and to review health mt list   Both of her hearing aids both broke - that made her upset this am   In general feels ok   bp is  good   Today BP Readings from Last 3 Encounters:  02/17/12 110/68  09/09/11 160/80  03/09/11 124/74    No cp or palpitations or headaches or edema  No side effects to medicines    Wt is up 4 lb- does not want to get any heavier , this is her comfortable weight   Hypothyroid- stable Parathyroid- stable  Cared for by Dr Evlyn Kanner Hx of thyroid ca with resection  mammo 5/12 Hx remotely of breast ca with curative bx and radiation No lumps on self exam-does not check well   Zoster status 3years ago -- she got it  Pneumovax- wants to get the shot today  Flu shot- ? If got one this past season  colonosc 11/09 ? If had polyp  Pap/ gyn exam  Has not had one in a while  Normal last ones  No gyn symptoms    Patient Active Problem List  Diagnosis  . ADENOCARCINOMA, THYROID GLAND  . HYPOTHYROIDISM  . HYPERLIPIDEMIA  . STRESS REACTION, ACUTE, WITH EMOTIONAL DISTURBANCE  . GRIEF REACTION, ACUTE  . TREMOR, ESSENTIAL, RIGHT HAND  . HYPERTENSION  . DEGENERATIVE DISC DISEASE, LUMBAR SPINE  . VERTIGO, HX OF  . POSTMENOPAUSAL STATUS  . POSTHERPETIC NEURALGIA  . SHOULDER PAIN, RIGHT  . Hypoparathyroidism  . Depression (emotion)  . Other screening mammogram   Past Medical History  Diagnosis Date  . Degenerative disk disease     lumbar- with back pain (s/p injections)   . Vertigo 2007-2010  . Hearing loss     aide  . Hyperlipidemia   . Hypertension   . Colon polyps   . Breast cancer     caught very early- bx was curative, then 29 radiology treatments   . Adenocarcinoma     thyroid with thyroidectomy   . Hypothyroid     after thyroidectomy (Dr Evlyn Kanner)  . Hypoparathyroidism     surgical     Past Surgical History  Procedure Date  . Breast biopsy 1998    breast cancer   . Thyroidectomy 11/10    last part of thryoid/neck disection and laryngectomy 1/12  . Laryngectomy     for thyroid cancer   History  Substance Use Topics  . Smoking status: Never Smoker   . Smokeless tobacco: Not on file  . Alcohol Use: No   Family History  Problem Relation Age of Onset  . Alzheimer's disease Father    Allergies  Allergen Reactions  . Prednisone     REACTION: facial swelling   Current Outpatient Prescriptions on File Prior to Visit  Medication Sig Dispense Refill  . atorvastatin (LIPITOR) 80 MG tablet Take 80 mg by mouth daily.        . calcitRIOL (ROCALTROL) 0.5 MCG capsule Take 0.5 mcg by mouth daily.        . calcium-vitamin D (OSCAL WITH D) 500-200 MG-UNIT per tablet Take 10 tablets daily       . chlorthalidone (HYGROTON) 25 MG tablet 1/2 tablet by mouth daily       . levothyroxine (SYNTHROID) 150 MCG tablet Take 150 mcg by mouth daily.  Takes Mon thru Sat daily but does not take on Sunday.      . losartan (COZAAR) 100 MG tablet Take 1 tablet (100 mg total) by mouth daily.  90 tablet  3  . Multiple Vitamins-Iron (MULTIVITAMIN/IRON) TABS Take by mouth daily.        . simvastatin (ZOCOR) 80 MG tablet Take 80 mg by mouth daily.        Marland Kitchen aspirin 81 MG tablet Take 81 mg by mouth daily.        . fluticasone (FLONASE) 50 MCG/ACT nasal spray Place 2 sprays into the nose daily.  16 g  11  . gabapentin (NEURONTIN) 300 MG capsule 300 mg. Take 2 by mouth daily for shingles pain: Caution for sedation       . HYDROcodone-acetaminophen (VICODIN) 5-500 MG per tablet 1 tablet. Take 1 - 2 by mouth up to every 6 hrs as needed for severe pain: watch for sedation            Review of Systems Review of Systems  Constitutional: Negative for fever, appetite change, and unexpected weight change. pos for some fatigue that is improving  Eyes: Negative for pain and visual disturbance.  ENT pos for  [facility] and also trache Respiratory: Negative for cough and shortness of breath.   Cardiovascular: Negative for cp or palpitations    Gastrointestinal: Negative for nausea, diarrhea and constipation.  Genitourinary: Negative for urgency and frequency.  Skin: Negative for pallor or rash   Neurological: Negative for weakness, light-headedness, numbness and headaches.  Hematological: Negative for adenopathy. Does not bruise/bleed easily.  Psychiatric/Behavioral: pos for depression and anxiety       Objective:   Physical Exam  Constitutional: She appears well-developed and well-nourished. No distress.  HENT:  Head: Normocephalic and atraumatic.  Right Ear: External ear normal.  Left Ear: External ear normal.  Nose: Nose normal.  Mouth/Throat: Oropharynx is clear and moist. No oropharyngeal exudate.       Tracheostomy present with plug- able to talk somewhat  Eyes: Conjunctivae and EOM are normal. Pupils are equal, round, and reactive to light. No scleral icterus.  Neck: Normal range of motion. Neck supple. No JVD present. Carotid bruit is not present. No thyromegaly present.       Post op changes- thyroidectomy  Cardiovascular: Normal rate, regular rhythm, normal heart sounds and intact distal pulses.  Exam reveals no gallop.   Pulmonary/Chest: Effort normal and breath sounds normal. No respiratory distress. She has no wheezes.  Abdominal: Soft. Bowel sounds are normal. She exhibits no distension, no abdominal bruit and no mass. There is no tenderness.  Musculoskeletal: She exhibits no edema and no tenderness.  Lymphadenopathy:    She has no cervical adenopathy.  Neurological: She is alert. She has normal reflexes. No cranial nerve deficit. She exhibits normal muscle tone. Coordination normal.  Skin: Skin is warm and dry. No rash noted. No erythema. No pallor.  Psychiatric: She has a normal mood and affect.          Assessment & Plan:

## 2012-02-17 NOTE — Assessment & Plan Note (Signed)
bp in fair control at this time  No changes needed  Disc lifstyle change with low sodium diet and exercise   Labs today 

## 2012-02-17 NOTE — Assessment & Plan Note (Signed)
Seeing counselor for this and grief and doing better Not tearful today for the first time Re assuring

## 2012-02-17 NOTE — Patient Instructions (Addendum)
We will set up a mammogram at check out  Pneumonia vaccine today Talk to Dr Evlyn Kanner about a bone density test  Labs today Take care of yourself

## 2012-02-17 NOTE — Assessment & Plan Note (Signed)
Scheduled annual screening mammogram Nl breast exam today  Encouraged monthly self exams   

## 2012-02-17 NOTE — Assessment & Plan Note (Signed)
Followed by Dr Evlyn Kanner after thyroidectomy Doing well  No clinical changes

## 2012-02-17 NOTE — Assessment & Plan Note (Signed)
Followed by oncol and Dr Evlyn Kanner For CT soon Overall doing well

## 2012-02-17 NOTE — Assessment & Plan Note (Signed)
Followed by Dr Evlyn Kanner after thyroidectomy for cancer  No symptoms  Did ask pt to disc dexa with him- since she is unsure when she had her last one - does have risk factors

## 2012-02-17 NOTE — Assessment & Plan Note (Signed)
Lipids today  On statin and diet

## 2012-02-20 ENCOUNTER — Encounter (HOSPITAL_COMMUNITY): Payer: Medicare Other

## 2012-02-20 ENCOUNTER — Inpatient Hospital Stay (HOSPITAL_COMMUNITY): Admission: RE | Admit: 2012-02-20 | Payer: Medicare Other | Source: Ambulatory Visit

## 2012-02-22 ENCOUNTER — Ambulatory Visit: Payer: Medicare Other | Admitting: Psychology

## 2012-02-28 ENCOUNTER — Ambulatory Visit: Payer: Medicare Other

## 2012-02-28 ENCOUNTER — Ambulatory Visit
Admission: RE | Admit: 2012-02-28 | Discharge: 2012-02-28 | Disposition: A | Discharge status: Home / Self Care | Payer: Medicare Other | Source: Ambulatory Visit | Attending: Family Medicine | Admitting: Family Medicine

## 2012-02-28 DIAGNOSIS — Z1231 Encounter for screening mammogram for malignant neoplasm of breast: Secondary | ICD-10-CM

## 2012-03-05 ENCOUNTER — Encounter (HOSPITAL_COMMUNITY)
Admission: RE | Admit: 2012-03-05 | Discharge: 2012-03-05 | Disposition: A | Discharge status: Home / Self Care | Payer: Medicare Other | Source: Ambulatory Visit | Attending: Endocrinology | Admitting: Endocrinology

## 2012-03-05 DIAGNOSIS — C73 Malignant neoplasm of thyroid gland: Secondary | ICD-10-CM | POA: Insufficient documentation

## 2012-03-05 MED ORDER — THYROTROPIN ALFA 1.1 MG IM SOLR
0.9000 mg | INTRAMUSCULAR | Status: DC
  Filled 2012-03-05: qty 0.9

## 2012-03-06 ENCOUNTER — Encounter (HOSPITAL_COMMUNITY)
Admission: RE | Admit: 2012-03-06 | Discharge: 2012-03-06 | Disposition: A | Discharge status: Home / Self Care | Payer: Medicare Other | Source: Ambulatory Visit | Attending: Endocrinology | Admitting: Endocrinology

## 2012-03-06 DIAGNOSIS — C73 Malignant neoplasm of thyroid gland: Secondary | ICD-10-CM | POA: Insufficient documentation

## 2012-03-07 ENCOUNTER — Ambulatory Visit (INDEPENDENT_AMBULATORY_CARE_PROVIDER_SITE_OTHER): Payer: Medicare Other | Admitting: Psychology

## 2012-03-07 ENCOUNTER — Ambulatory Visit (HOSPITAL_COMMUNITY)
Admission: RE | Admit: 2012-03-07 | Discharge: 2012-03-07 | Disposition: A | Discharge status: Home / Self Care | Payer: Medicare Other | Source: Ambulatory Visit | Attending: Endocrinology | Admitting: Endocrinology

## 2012-03-07 DIAGNOSIS — F331 Major depressive disorder, recurrent, moderate: Secondary | ICD-10-CM

## 2012-03-09 ENCOUNTER — Encounter (HOSPITAL_COMMUNITY)
Admission: RE | Admit: 2012-03-09 | Discharge: 2012-03-09 | Disposition: A | Discharge status: Home / Self Care | Payer: Medicare Other | Source: Ambulatory Visit | Attending: Endocrinology | Admitting: Endocrinology

## 2012-03-09 MED ORDER — SODIUM IODIDE I 131 CAPSULE
4.0000 | Freq: Once | INTRAVENOUS | Status: AC | PRN
  Administered 2012-03-09: 4 via ORAL

## 2012-03-12 LAB — THYROGLOBULIN ANTIBODY: Thyroglobulin Ab: <20 U/mL (ref <40.0)

## 2012-03-12 LAB — THYROGLOBULIN LEVEL: Thyroglobulin: 36.3 ng/mL (ref 0.0–55.0)

## 2012-03-14 ENCOUNTER — Ambulatory Visit: Payer: Medicare Other | Admitting: Psychology

## 2012-03-21 MED FILL — Thyrotropin Alfa For Inj 1.1 MG: INTRAMUSCULAR | Qty: 1.8 | Status: AC

## 2012-04-13 ENCOUNTER — Other Ambulatory Visit (HOSPITAL_COMMUNITY): Payer: Self-pay | Admitting: Endocrinology

## 2012-04-13 DIAGNOSIS — C73 Malignant neoplasm of thyroid gland: Secondary | ICD-10-CM

## 2012-04-25 ENCOUNTER — Inpatient Hospital Stay (HOSPITAL_COMMUNITY): Admission: RE | Admit: 2012-04-25 | Payer: Medicare Other | Source: Ambulatory Visit

## 2012-04-26 ENCOUNTER — Ambulatory Visit (HOSPITAL_COMMUNITY): Payer: Medicare Other

## 2012-04-27 ENCOUNTER — Encounter (HOSPITAL_COMMUNITY): Payer: Medicare Other

## 2012-05-01 ENCOUNTER — Ambulatory Visit (HOSPITAL_COMMUNITY): Payer: Medicare Other

## 2012-05-02 ENCOUNTER — Ambulatory Visit (HOSPITAL_COMMUNITY): Payer: Medicare Other

## 2012-05-03 ENCOUNTER — Encounter (HOSPITAL_COMMUNITY): Payer: Medicare Other

## 2012-05-09 ENCOUNTER — Telehealth: Payer: Self-pay

## 2012-05-09 NOTE — Telephone Encounter (Signed)
pts son wants to know if pt has had pneumonia vaccine. Advised had on 02/17/12. Pt's son understood.

## 2012-05-25 ENCOUNTER — Other Ambulatory Visit (HOSPITAL_COMMUNITY): Payer: Self-pay | Admitting: Endocrinology

## 2012-05-25 DIAGNOSIS — C73 Malignant neoplasm of thyroid gland: Secondary | ICD-10-CM

## 2012-06-11 ENCOUNTER — Ambulatory Visit (HOSPITAL_COMMUNITY): Payer: Medicare Other

## 2012-06-12 ENCOUNTER — Ambulatory Visit (HOSPITAL_COMMUNITY): Payer: Medicare Other

## 2012-06-13 ENCOUNTER — Encounter (HOSPITAL_COMMUNITY): Payer: Medicare Other

## 2012-06-18 ENCOUNTER — Encounter (HOSPITAL_COMMUNITY)
Admission: RE | Admit: 2012-06-18 | Discharge: 2012-06-18 | Disposition: A | Discharge status: Home / Self Care | Payer: Medicare Other | Source: Ambulatory Visit | Attending: Endocrinology | Admitting: Endocrinology

## 2012-06-18 DIAGNOSIS — C73 Malignant neoplasm of thyroid gland: Secondary | ICD-10-CM | POA: Insufficient documentation

## 2012-06-18 MED ORDER — THYROTROPIN ALFA 1.1 MG IM SOLR
0.9000 mg | INTRAMUSCULAR | Status: AC
  Administered 2012-06-18: 0.9 mg via INTRAMUSCULAR
  Filled 2012-06-18: qty 0.9

## 2012-06-19 ENCOUNTER — Encounter (HOSPITAL_COMMUNITY)
Admission: RE | Admit: 2012-06-19 | Discharge: 2012-06-19 | Disposition: A | Discharge status: Home / Self Care | Payer: Medicare Other | Source: Ambulatory Visit | Attending: Endocrinology | Admitting: Endocrinology

## 2012-06-19 MED ORDER — THYROTROPIN ALFA 1.1 MG IM SOLR
0.9000 mg | INTRAMUSCULAR | Status: AC
  Administered 2012-06-19: 0.9 mg via INTRAMUSCULAR
  Filled 2012-06-19: qty 0.9

## 2012-06-20 ENCOUNTER — Encounter (HOSPITAL_COMMUNITY)
Admission: RE | Admit: 2012-06-20 | Discharge: 2012-06-20 | Disposition: A | Discharge status: Home / Self Care | Payer: Medicare Other | Source: Ambulatory Visit | Attending: Endocrinology | Admitting: Endocrinology

## 2012-06-20 MED ORDER — SODIUM IODIDE I 131 CAPSULE
151.0000 | Freq: Once | INTRAVENOUS | Status: AC | PRN
  Administered 2012-06-20: 151 via ORAL

## 2012-06-22 ENCOUNTER — Encounter (HOSPITAL_COMMUNITY): Payer: Medicare Other

## 2012-06-29 ENCOUNTER — Encounter (HOSPITAL_COMMUNITY)
Admission: RE | Admit: 2012-06-29 | Discharge: 2012-06-29 | Disposition: A | Discharge status: Home / Self Care | Payer: Medicare Other | Source: Ambulatory Visit | Attending: Endocrinology | Admitting: Endocrinology

## 2012-06-29 DIAGNOSIS — C73 Malignant neoplasm of thyroid gland: Secondary | ICD-10-CM | POA: Insufficient documentation

## 2012-09-20 ENCOUNTER — Other Ambulatory Visit: Payer: Self-pay

## 2012-09-20 NOTE — Telephone Encounter (Signed)
Gloria Parker, pt's son request refill on losartan to [facility] [address] Gloria Parker said pt last seen 01/2012; does pt need to be seen before 01/2013? Gloria Parker said pt is doing well and seeing endocrinologist and surgeon q 3-4 months.Please advise. Gloria Parker request call back.

## 2012-09-20 NOTE — Telephone Encounter (Signed)
Left voicemail on pt's son phone letting him know July is fine and to call back to schedule appt and once that's done we can refill meds until then

## 2012-09-20 NOTE — Telephone Encounter (Signed)
July is fine - please schedule and refil until then, thanks

## 2012-09-21 MED ORDER — LOSARTAN POTASSIUM 100 MG PO TABS: 100.0000 mg | ORAL_TABLET | Freq: Every day | ORAL | Status: DC

## 2012-09-21 NOTE — Telephone Encounter (Signed)
Rx refilled until pt's CPE and left voicemail on pt's phone to let him know Rx was sent

## 2012-09-21 NOTE — Telephone Encounter (Signed)
Pt's son left v/m pt has CPX scheduled 03/01/13 and son request refill Losartan to [facility] [address] Mr Scorsone request call back to let know refill done; pt running low on med.

## 2013-02-08 ENCOUNTER — Telehealth: Payer: Self-pay

## 2013-02-08 NOTE — Telephone Encounter (Signed)
F/u appt scheduled for 02/18/13 to discuss memory issues

## 2013-02-08 NOTE — Telephone Encounter (Signed)
Left voicemail letting son know that since this is a new issues he needs to call and scheduled a f/u (30 min) to discuss pt's memory, he can call office and schedule a f/u directly with the schedulers

## 2013-02-08 NOTE — Telephone Encounter (Signed)
pts son Gloria Parker said pt has CPX 03/01/13;Joel concerned pt's memory has decreased significantly in last several months and Gloria Parker wants to know if there is testing that could be done during the CPX for memory loss or would he need to schedule another appt.Please advise.

## 2013-02-08 NOTE — Telephone Encounter (Signed)
Please schedule another appt for that since it is a new problem-not part of health mt, and also have a family member come with her at that visit -please schedule 30 min f/u

## 2013-02-18 ENCOUNTER — Ambulatory Visit: Payer: Medicare Other | Admitting: Family Medicine

## 2013-02-20 ENCOUNTER — Ambulatory Visit (INDEPENDENT_AMBULATORY_CARE_PROVIDER_SITE_OTHER): Payer: Medicare Other | Admitting: Family Medicine

## 2013-02-20 ENCOUNTER — Encounter: Payer: Self-pay | Admitting: Family Medicine

## 2013-02-20 VITALS — BP 128/74 | HR 86 | Temp 99.1°F | Ht 59.0 in | Wt 118.5 lb

## 2013-02-20 DIAGNOSIS — R413 Other amnesia: Secondary | ICD-10-CM

## 2013-02-20 NOTE — Progress Notes (Signed)
Subjective:    Patient ID: Gloria Parker, female    DOB: November 19, 1940, 72 y.o.   MRN: [id number]  HPI Here for concerns about memory   Family has noted this  She has argued a lot with family about this   She has been getting lost going places that are familiar to her ( for example going to the hospital or coming here recently) Worse in the past 8 months  Forgot where a key was stored  Son lives with her - notices it  Short term memory - will ask questions over an over  Has to write down appts   No confusion at all  No disorientation Is frustrated with short term mrmory loss   Has put something on stove and forgotten it  Has not left car running  Has not left water running   Pt thinks her father may have had dementia   Has lab Friday and physical next week    Patient Active Problem List   Diagnosis Date Noted  . Other screening mammogram 02/17/2012  . Depression (emotion) 09/09/2011  . 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 SHOULDER PAIN, RIGHT 09/14/2010  . POSTHERPETIC NEURALGIA 07/08/2010  . TREMOR, ESSENTIAL, RIGHT HAND 05/10/2010  . GRIEF REACTION, ACUTE 02/12/2010  . ADENOCARCINOMA, THYROID GLAND 08/21/2009  . POSTMENOPAUSAL STATUS 08/21/2009  . VERTIGO, HX OF 02/20/2009  . HYPOTHYROIDISM 01/06/2009  . HYPERLIPIDEMIA 01/06/2009  . STRESS REACTION, ACUTE, WITH EMOTIONAL DISTURBANCE 01/06/2009  . HYPERTENSION 01/06/2009  . 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 Degenerative disk disease     lumbar- with back pain (s/p injections)   . Vertigo 2007-2010  . Hearing loss     aide  . Hyperlipidemia   . Hypertension   . Colon polyps   . Breast cancer     caught very early- bx was curative, then 32 radiology treatments   . Adenocarcinoma     thyroid with thyroidectomy   . Hypothyroid     after thyroidectomy (Dr Evlyn Kanner)  . Hypoparathyroidism     surgical    Past Surgical History  Procedure Laterality Date   . Breast biopsy  1998    breast cancer   . Thyroidectomy  11/10    last part of thryoid/neck disection and laryngectomy 1/12  . Laryngectomy      for thyroid cancer   History  Substance Use Topics  . Smoking status: Never Smoker   . Smokeless tobacco: Not on file  . Alcohol Use: No   Family History  Problem Relation Age of Onset  . Alzheimer's disease Father    Allergies  Allergen Reactions  . Prednisone     REACTION: facial swelling   Current Outpatient Prescriptions on File Prior to Visit  Medication Sig Dispense Refill  . atorvastatin (LIPITOR) 80 MG tablet Take 80 mg by mouth daily.        . calcitRIOL (ROCALTROL) 0.5 MCG capsule Take 0.5 mcg by mouth daily.       . calcium-vitamin D (OSCAL WITH D) 500-200 MG-UNIT per tablet Take 10 tablets daily       . losartan (COZAAR) 100 MG tablet Take 1 tablet (100 mg total) by mouth daily.  90 tablet  1  . Multiple Vitamins-Iron (MULTIVITAMIN/IRON) TABS Take by mouth daily.         No current facility-administered medications on file prior to visit.     Review of Systems Review of Systems  Constitutional:  Negative for fever, appetite change, fatigue and unexpected weight change.  Eyes: Negative for pain and visual disturbance.  ENT -neg for problems with trache  Respiratory: Negative for cough and shortness of breath.   Cardiovascular: Negative for cp or palpitations    Gastrointestinal: Negative for nausea, diarrhea and constipation.  Genitourinary: Negative for urgency and frequency.  Skin: Negative for pallor or rash   Neurological: Negative for weakness, light-headedness, numbness and headaches.  Hematological: Negative for adenopathy. Does not bruise/bleed easily.  Psychiatric/Behavioral: pos for anxiety at times and ongoing grief with some tearfulness  pos for some short term memory loss        Objective:   Physical Exam  Constitutional: She appears well-developed and well-nourished. No distress.  HENT:  Head:  Normocephalic and atraumatic.  Trache intact- speaking better with it now - still difficult to understand at times    Eyes: Conjunctivae and EOM are normal. Pupils are equal, round, and reactive to light. No scleral icterus.  Neck: Normal range of motion. Neck supple.  Lymphadenopathy:    She has no cervical adenopathy.  Neurological: She is alert. She has normal reflexes.  Skin: Skin is warm and dry. No rash noted. No pallor.  Psychiatric: Her behavior is normal. Her mood appears anxious. Her affect is labile. She exhibits abnormal recent memory.  Pt becomes tearful when discussing death of her husband but is able to recover quickly Attentive and answers questions appropriately Mildly anxious Supportive son is present           Assessment & Plan:

## 2013-02-20 NOTE — Patient Instructions (Addendum)
We are going to further work up memory and cognition problems with additional labs I will add on for your Friday draw Will discuss them at your physical and make a plan

## 2013-02-21 ENCOUNTER — Telehealth: Payer: Self-pay | Admitting: Family Medicine

## 2013-02-21 DIAGNOSIS — R413 Other amnesia: Secondary | ICD-10-CM

## 2013-02-21 DIAGNOSIS — E785 Hyperlipidemia, unspecified: Secondary | ICD-10-CM

## 2013-02-21 DIAGNOSIS — E039 Hypothyroidism, unspecified: Secondary | ICD-10-CM

## 2013-02-21 DIAGNOSIS — I10 Essential (primary) hypertension: Secondary | ICD-10-CM

## 2013-02-21 DIAGNOSIS — E209 Hypoparathyroidism, unspecified: Secondary | ICD-10-CM

## 2013-02-21 NOTE — Telephone Encounter (Signed)
Message copied by Judy Pimple on Thu Feb 21, 2013  8:46 PM ------      Message from: Alvina Chou      Created: Tue Feb 12, 2013 11:36 AM      Regarding: Lab orders for Friday, 7.25.14       Patient is scheduled for CPX labs, please order future labs, Thanks , Terri       ------

## 2013-02-21 NOTE — Assessment & Plan Note (Addendum)
Some worrisome red flags such as getting lost in familiar places  Scores 25/30 on MMS exam today- with excellent concentration and nl clock draw Most of deficit is short term memory  Will add labs to upcoming draw for memory loss  Disc at f/u next week  Then make plan  Disc diff dx of this -incl dementia - but recent cancer and grief and other stressors could also play a big role  Disc in detail with pt and her son  >25 min spent with face to face with patient, >50% counseling and/or coordinating care

## 2013-02-22 ENCOUNTER — Other Ambulatory Visit (INDEPENDENT_AMBULATORY_CARE_PROVIDER_SITE_OTHER): Payer: Medicare Other

## 2013-02-22 DIAGNOSIS — E039 Hypothyroidism, unspecified: Secondary | ICD-10-CM

## 2013-02-22 DIAGNOSIS — E209 Hypoparathyroidism, unspecified: Secondary | ICD-10-CM

## 2013-02-22 DIAGNOSIS — I10 Essential (primary) hypertension: Secondary | ICD-10-CM

## 2013-02-22 DIAGNOSIS — E785 Hyperlipidemia, unspecified: Secondary | ICD-10-CM

## 2013-02-22 DIAGNOSIS — R413 Other amnesia: Secondary | ICD-10-CM

## 2013-02-22 LAB — COMPREHENSIVE METABOLIC PANEL
ALT: 16 U/L (ref 0–35)
AST: 19 U/L (ref 0–37)
Albumin: 4 g/dL (ref 3.5–5.2)
Alkaline Phosphatase: 81 U/L (ref 39–117)
BUN: 19 mg/dL (ref 6–23)
Calcium: 9.4 mg/dL (ref 8.4–10.5)
Chloride: 102 mEq/L (ref 96–112)
Potassium: 4.3 mEq/L (ref 3.5–5.1)

## 2013-02-22 LAB — LIPID PANEL
HDL: 62.5 mg/dL (ref >39.00)
LDL Cholesterol: 93 mg/dL (ref 0–99)
Total CHOL/HDL Ratio: 3
Triglycerides: 89 mg/dL (ref 0.0–149.0)

## 2013-02-22 LAB — B12 AND FOLATE PANEL: Vitamin B-12: 578 pg/mL (ref 211–911)

## 2013-02-22 LAB — CBC WITH DIFFERENTIAL/PLATELET
Basophils Absolute: 0 10*3/uL (ref 0.0–0.1)
Eosinophils Absolute: 0.2 10*3/uL (ref 0.0–0.7)
Lymphocytes Relative: 22.6 % (ref 12.0–46.0)
MCHC: 33.3 g/dL (ref 30.0–36.0)
MCV: 92.2 fl (ref 78.0–100.0)
Monocytes Absolute: 0.3 10*3/uL (ref 0.1–1.0)
Neutrophils Relative %: 67.4 % (ref 43.0–77.0)
Platelets: 307 10*3/uL (ref 150.0–400.0)
RDW: 13.9 % (ref 11.5–14.6)

## 2013-02-22 LAB — TSH: TSH: 0.09 u[IU]/mL — ABNORMAL LOW (ref 0.35–5.50)

## 2013-02-22 LAB — SEDIMENTATION RATE: Sed Rate: 29 mm/hr — ABNORMAL HIGH (ref 0–22)

## 2013-03-01 ENCOUNTER — Ambulatory Visit (INDEPENDENT_AMBULATORY_CARE_PROVIDER_SITE_OTHER): Payer: Medicare Other | Admitting: Family Medicine

## 2013-03-01 ENCOUNTER — Encounter: Payer: Self-pay | Admitting: Family Medicine

## 2013-03-01 VITALS — BP 120/82 | HR 80 | Temp 98.2°F | Ht 58.25 in | Wt 118.8 lb

## 2013-03-01 DIAGNOSIS — E785 Hyperlipidemia, unspecified: Secondary | ICD-10-CM

## 2013-03-01 DIAGNOSIS — E209 Hypoparathyroidism, unspecified: Secondary | ICD-10-CM

## 2013-03-01 DIAGNOSIS — E039 Hypothyroidism, unspecified: Secondary | ICD-10-CM

## 2013-03-01 DIAGNOSIS — I10 Essential (primary) hypertension: Secondary | ICD-10-CM

## 2013-03-01 DIAGNOSIS — R413 Other amnesia: Secondary | ICD-10-CM

## 2013-03-01 NOTE — Progress Notes (Signed)
Subjective:    Patient ID: Gloria Parker, female    DOB: 23-Dec-1940, 72 y.o.   MRN: [id number]  HPI Here for medicare physical but pt forgot hearing aides - has a hard time answering questions today    Also f/u of labs for memory loss Pt thinks memory is about the same Last visit 25/30 MMS exam   Labs overall ok but thyroid and parathyroid numbers are off - ? If this affects her cognition at all   Lab Results  Component Value Date   TSH 0.09* 02/22/2013    Lab Results  Component Value Date   PTH 4.9* 02/22/2013   CALCIUM 9.4 02/22/2013   PHOS 4.7* 08/19/2009    Dr Evlyn Kanner is her endo- Dr Evlyn Kanner on 8/20  Sent him a copy also    Flu vaccine-- had one this fall  ptx 7/13 Td 3/09  Patient Active Problem List   Diagnosis Date Noted  . Memory loss 02/20/2013  . Other screening mammogram 02/17/2012  . Depression (emotion) 09/09/2011  . 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 SHOULDER PAIN, RIGHT 09/14/2010  . POSTHERPETIC NEURALGIA 07/08/2010  . TREMOR, ESSENTIAL, RIGHT HAND 05/10/2010  . GRIEF REACTION, ACUTE 02/12/2010  . ADENOCARCINOMA, THYROID GLAND 08/21/2009  . POSTMENOPAUSAL STATUS 08/21/2009  . VERTIGO, HX OF 02/20/2009  . HYPOTHYROIDISM 01/06/2009  . HYPERLIPIDEMIA 01/06/2009  . STRESS REACTION, ACUTE, WITH EMOTIONAL DISTURBANCE 01/06/2009  . HYPERTENSION 01/06/2009  . 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 Degenerative disk disease     lumbar- with back pain (s/p injections)   . Vertigo 2007-2010  . Hearing loss     aide  . Hyperlipidemia   . Hypertension   . Colon polyps   . Breast cancer     caught very early- bx was curative, then 15 radiology treatments   . Adenocarcinoma     thyroid with thyroidectomy   . Hypothyroid     after thyroidectomy (Dr Evlyn Kanner)  . Hypoparathyroidism     surgical    Past Surgical History  Procedure Laterality Date  . Breast biopsy  1998    breast cancer   .  Thyroidectomy  11/10    last part of thryoid/neck disection and laryngectomy 1/12  . Laryngectomy      for thyroid cancer   History  Substance Use Topics  . Smoking status: Never Smoker   . Smokeless tobacco: Not on file  . Alcohol Use: No   Family History  Problem Relation Age of Onset  . Alzheimer's disease Father    Allergies  Allergen Reactions  . Prednisone     REACTION: facial swelling   Current Outpatient Prescriptions on File Prior to Visit  Medication Sig Dispense Refill  . atorvastatin (LIPITOR) 80 MG tablet Take 80 mg by mouth daily.        . calcitRIOL (ROCALTROL) 0.5 MCG capsule Take 0.5 mcg by mouth daily.       . calcium-vitamin D (OSCAL WITH D) 500-200 MG-UNIT per tablet Take 10 tablets daily       . levothyroxine (SYNTHROID, LEVOTHROID) 112 MCG tablet Take 112 mcg by mouth. Takes Mon thru Sat daily, on Sunday takes 1/2 tab      . losartan (COZAAR) 100 MG tablet Take 1 tablet (100 mg total) by mouth daily.  90 tablet  1  . Multiple Vitamins-Iron (MULTIVITAMIN/IRON) TABS Take by mouth daily.        Marland Kitchen omeprazole (PRILOSEC) 20  MG capsule Take 20.5 mg by mouth daily.        No current facility-administered medications on file prior to visit.     Review of Systems Pos for short term memory loss  Pos for fatigue , neg for unexpected weight change  Neg for appetite change  Neg for muscle spasms or twitching     Objective:   Physical Exam  Constitutional: She appears well-developed and well-nourished. No distress.  Well appearing / frail appearing   HENT:  Head: Normocephalic.  Mouth/Throat: Oropharynx is clear and moist.  Talks with a trache- some more congestion than usual today  Hard of hearing without hearing aides today  Eyes: Conjunctivae and EOM are normal. Pupils are equal, round, and reactive to light.  Neck: Normal range of motion. Neck supple. No thyromegaly present.  S/p thyroid removal Trache site looks ok    Cardiovascular: Normal rate and  regular rhythm.   Pulmonary/Chest: Effort normal and breath sounds normal. No respiratory distress.  Lymphadenopathy:    She has no cervical adenopathy.  Neurological: She is alert. She has normal reflexes.  Skin: Skin is warm and dry. No rash noted. No erythema. No pallor.  Psychiatric: She has a normal mood and affect.  Pt is pleasant and in a very good mood today          Assessment & Plan:

## 2013-03-01 NOTE — Assessment & Plan Note (Signed)
Low PTH  Will f/u with Dr Evlyn Kanner

## 2013-03-01 NOTE — Assessment & Plan Note (Signed)
MMS score 25/30 with good concentration/poor short term memory Pt forgot hearing aides for physical today  ? If her hypothyroidism or hypoparathyroidism is playing any role in this  Will plan to disc further after her visit with Dr Evlyn Kanner later this month Lab Results  Component Value Date   TSH 0.09* 02/22/2013

## 2013-03-01 NOTE — Assessment & Plan Note (Signed)
Disc goals for lipids and reasons to control them Rev labs with pt Rev low sat fat diet in detail   

## 2013-03-01 NOTE — Assessment & Plan Note (Signed)
Low tsh  Will follow up with Dr Evlyn Kanner later this mo

## 2013-03-01 NOTE — Patient Instructions (Addendum)
Your tsh (thyroid) and PTH (parathyroid) numbers are off Follow up with Dr Evlyn Kanner as planned this month - to discuss this  I wonder if these issues are influencing your memory problem  Other labs are ok -here is a copy You are due for a mammogram-please make your own appt. For that  We will re schedule your medicare physical because you do not have your hearing aides today  Please re schedule medicare physical for any 30 min slot in the future   I'm glad you are doing ok  We will discuss memory loss further after Dr Evlyn Kanner sees you and discusses your labs

## 2013-03-01 NOTE — Assessment & Plan Note (Signed)
bp in fair control at this time  No changes needed  Disc lifstyle change with low sodium diet and exercise   

## 2013-03-04 ENCOUNTER — Other Ambulatory Visit: Payer: Self-pay

## 2013-03-04 DIAGNOSIS — Z1231 Encounter for screening mammogram for malignant neoplasm of breast: Secondary | ICD-10-CM

## 2013-03-12 ENCOUNTER — Encounter: Payer: Self-pay | Admitting: Family Medicine

## 2013-03-12 ENCOUNTER — Ambulatory Visit (INDEPENDENT_AMBULATORY_CARE_PROVIDER_SITE_OTHER): Payer: Medicare Other | Admitting: Family Medicine

## 2013-03-12 VITALS — BP 120/70 | HR 89 | Temp 98.5°F | Ht <= 58 in | Wt 121.0 lb

## 2013-03-12 DIAGNOSIS — R413 Other amnesia: Secondary | ICD-10-CM

## 2013-03-12 DIAGNOSIS — Z860100 Personal history of colon polyps, unspecified: Secondary | ICD-10-CM

## 2013-03-12 DIAGNOSIS — I10 Essential (primary) hypertension: Secondary | ICD-10-CM

## 2013-03-12 DIAGNOSIS — Z0000 Encounter for general adult medical examination without abnormal findings: Secondary | ICD-10-CM

## 2013-03-12 NOTE — Patient Instructions (Addendum)
Work on your living will  discuss your thyroid and parathyroid with Dr Evlyn Kanner in relation to memory problems We may consider aricept in the future form memory  We will refer you to a colonoscopy in Nov at check out today Discuss bone density testing with Dr Evlyn Kanner- I am happy to order that if he wants me to- you have risk factors for osteoporosis

## 2013-03-12 NOTE — Progress Notes (Signed)
Subjective:    Patient ID: Gloria Parker, female    DOB: 1940/10/30, 72 y.o.   MRN: [id number]  HPI I have personally reviewed the Medicare Annual Wellness questionnaire and have noted 1. The patient's medical and social history 2. Their use of alcohol, tobacco or illicit drugs 3. Their current medications and supplements 4. The patient's functional ability including ADL's, fall risks, home safety risks and hearing or visual             impairment. 5. Diet and physical activities 6. Evidence for depression or mood disorders  The patients weight, height, BMI have been recorded in the chart and visual acuity is per eye clinic.  I have made referrals, counseling and provided education to the patient based review of the above and I have provided the pt with a written personalized care plan for preventive services.  See scanned forms.  Routine anticipatory guidance given to patient.  See health maintenance. Flu- 9/13 and will get this fall (she gets it with her son) Nell Range her vaccine in 2011 PNA- up to date with that - had in 2013  Tetanus 3/09 - is up to date  Colon  06/18/08 - wants to get that scheduled for fall - because she had polyps  Breast cancer screening 7/13- has it planned 8/22 No lumps on exam  Pap smear was 1/08 - no female problems  Advance directive-has discussed getting a living will and her son is power of attourney (she has the paper work) Cognitive function addressed- see scanned forms- and if abnormal then additional documentation follows.- see last visit and pending endocrinology visit to address tsh - then may consider aricept    PMH and SH reviewed  Meds, vitals, and allergies reviewed.   ROS: See HPI.  Otherwise negative.    Mood-she stays very motivated and is still grieving loss of her husband - and still enjoys the things she used to  Does not think that she is depressed   Falls-none  No broken bones  Takes her supplements   dexa 3/09 normal  T scores  Has hypoparathyroidism-sees Dr Evlyn Kanner  Has hypothyroidism-post surg- sees Dr Evlyn Kanner  Patient Active Problem List   Diagnosis Date Noted  . Encounter for Medicare annual wellness exam 03/12/2013  . Memory loss 02/20/2013  . Other screening mammogram 02/17/2012  . Depression (emotion) 09/09/2011  . 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 SHOULDER PAIN, RIGHT 09/14/2010  . POSTHERPETIC NEURALGIA 07/08/2010  . TREMOR, ESSENTIAL, RIGHT HAND 05/10/2010  . GRIEF REACTION, ACUTE 02/12/2010  . ADENOCARCINOMA, THYROID GLAND 08/21/2009  . POSTMENOPAUSAL STATUS 08/21/2009  . VERTIGO, HX OF 02/20/2009  . HYPOTHYROIDISM 01/06/2009  . HYPERLIPIDEMIA 01/06/2009  . STRESS REACTION, ACUTE, WITH EMOTIONAL DISTURBANCE 01/06/2009  . HYPERTENSION 01/06/2009  . 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 Degenerative disk disease     lumbar- with back pain (s/p injections)   . Vertigo 2007-2010  . Hearing loss     aide  . Hyperlipidemia   . Hypertension   . Colon polyps   . Breast cancer     caught very early- bx was curative, then 81 radiology treatments   . Adenocarcinoma     thyroid with thyroidectomy   . Hypothyroid     after thyroidectomy (Dr Evlyn Kanner)  . Hypoparathyroidism     surgical    Past Surgical History  Procedure Laterality Date  . Breast biopsy  1998    breast cancer   .  Thyroidectomy  11/10    last part of thryoid/neck disection and laryngectomy 1/12  . Laryngectomy      for thyroid cancer   History  Substance Use Topics  . Smoking status: Never Smoker   . Smokeless tobacco: Never Used  . Alcohol Use: No   Family History  Problem Relation Age of Onset  . Alzheimer's disease Father    Allergies  Allergen Reactions  . Prednisone     REACTION: facial swelling   Current Outpatient Prescriptions on File Prior to Visit  Medication Sig Dispense Refill  . atorvastatin (LIPITOR) 80 MG tablet Take 80 mg by mouth  daily.        . calcitRIOL (ROCALTROL) 0.5 MCG capsule Take 0.5 mcg by mouth daily.       . calcium-vitamin D (OSCAL WITH D) 500-200 MG-UNIT per tablet Take 10 tablets daily       . levothyroxine (SYNTHROID, LEVOTHROID) 112 MCG tablet Take 112 mcg by mouth. Takes Mon thru Sat daily, on Sunday takes 1/2 tab      . losartan (COZAAR) 100 MG tablet Take 1 tablet (100 mg total) by mouth daily.  90 tablet  1  . Multiple Vitamins-Iron (MULTIVITAMIN/IRON) TABS Take by mouth daily.        Marland Kitchen omeprazole (PRILOSEC) 20 MG capsule Take 20.5 mg by mouth daily.        No current facility-administered medications on file prior to visit.     Review of Systems Review of Systems  Constitutional: Negative for fever, appetite change, fatigue and unexpected weight change.  Eyes: Negative for pain and visual disturbance.  Respiratory: Negative for cough and shortness of breath.   Cardiovascular: Negative for cp or palpitations    Gastrointestinal: Negative for nausea, diarrhea and constipation.  Genitourinary: Negative for urgency and frequency.  Skin: Negative for pallor or rash   Neurological: Negative for weakness, light-headedness, numbness and headaches. pos for short term memory loss/ difficulties  Hematological: Negative for adenopathy. Does not bruise/bleed easily.  Psychiatric/Behavioral: Negative for dysphoric mood. The patient is not nervous/anxious.         Objective:   Physical Exam  Constitutional: She appears well-developed and well-nourished. No distress.  HENT:  Head: Normocephalic and atraumatic.  Right Ear: External ear normal.  Left Ear: External ear normal.  Nose: Nose normal.  Mouth/Throat: Oropharynx is clear and moist.  Eyes: Conjunctivae and EOM are normal. Pupils are equal, round, and reactive to light. Right eye exhibits no discharge. Left eye exhibits no discharge. No scleral icterus.  Neck: Normal range of motion. Neck supple. No JVD present. No thyromegaly present.  Trache  in place- nl appearing   Cardiovascular: Normal rate, regular rhythm, normal heart sounds and intact distal pulses.  Exam reveals no gallop.   Pulmonary/Chest: Effort normal and breath sounds normal. No respiratory distress. She has no wheezes. She exhibits no tenderness.  Abdominal: Soft. Bowel sounds are normal. She exhibits no distension, no abdominal bruit and no mass. There is no tenderness.  Genitourinary: No breast swelling, tenderness, discharge or bleeding.  Breast exam: No mass, nodules, thickening, tenderness, bulging, retraction, inflamation, nipple discharge or skin changes noted.  No axillary or clavicular LA.  Chaperoned exam.    Musculoskeletal: She exhibits no edema and no tenderness.  Lymphadenopathy:    She has no cervical adenopathy.  Neurological: She is alert. She has normal reflexes. No cranial nerve deficit. She exhibits normal muscle tone. Coordination normal.  Skin: Skin is warm and dry.  No rash noted. No erythema. No pallor.  Psychiatric: She has a normal mood and affect.  Talkative and cheerful Supportive son is present          Assessment & Plan:

## 2013-03-13 NOTE — Assessment & Plan Note (Signed)
bp in fair control at this time  No changes needed  Disc lifstyle change with low sodium diet and exercise   

## 2013-03-13 NOTE — Assessment & Plan Note (Signed)
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

## 2013-03-13 NOTE — Assessment & Plan Note (Signed)
Rev lab and she will f/u with Dr Evlyn Kanner next for hypothyroid/ hypoparathyroid - to see if any adj will help  Concentration is good Had a stressful year  If no improvement- disc poss of starting aricept with pt and son- they are agreeable

## 2013-03-13 NOTE — Assessment & Plan Note (Signed)
Due for recall colonosc in 11/14- will get that scheduled No bowel changes

## 2013-03-19 ENCOUNTER — Encounter: Payer: Self-pay | Admitting: Internal Medicine

## 2013-03-20 ENCOUNTER — Telehealth: Payer: Self-pay | Admitting: *Deleted

## 2013-03-20 DIAGNOSIS — R413 Other amnesia: Secondary | ICD-10-CM

## 2013-03-20 DIAGNOSIS — E039 Hypothyroidism, unspecified: Secondary | ICD-10-CM

## 2013-03-20 DIAGNOSIS — E209 Hypoparathyroidism, unspecified: Secondary | ICD-10-CM

## 2013-03-20 DIAGNOSIS — E2839 Other primary ovarian failure: Secondary | ICD-10-CM

## 2013-03-20 MED ORDER — DONEPEZIL HCL 5 MG PO TABS: 5.0000 mg | ORAL_TABLET | Freq: Every evening | ORAL | Status: DC | PRN

## 2013-03-20 NOTE — Telephone Encounter (Signed)
Pt had appt with Dr. Evlyn Kanner today. Pt's son said that Dr. Evlyn Kanner said he thinks pt should have bone density test done so pt would like referral. I advise Shirlee Limerick will call him to set up appt., also Dr. Evlyn Kanner doesn't think that pt's low thyroid levels has anything to do with her memory loss, so he recommends that we treat memory loss as a separate problem, so pt's son would like to know what is the next step to treat pt's memory loss, please advise

## 2013-03-20 NOTE — Telephone Encounter (Signed)
I will refer for dexa  Please call in aricept - 5 mg daily - this should begin to slow down memory loss (not curative however)- if side eff like nausea let me know (we had disc this option in office previously) Follow up with me in 4-6 wk and we will advance dose if well tolerated

## 2013-03-20 NOTE — Telephone Encounter (Signed)
Rx sent to pharmacy and pt's son advise of Dr. Royden Purl comments, f/u scheduled for 04/19/13

## 2013-03-22 ENCOUNTER — Ambulatory Visit
Admission: RE | Admit: 2013-03-22 | Discharge: 2013-03-22 | Disposition: A | Discharge status: Home / Self Care | Payer: Medicare Other | Source: Ambulatory Visit

## 2013-03-22 DIAGNOSIS — Z1231 Encounter for screening mammogram for malignant neoplasm of breast: Secondary | ICD-10-CM

## 2013-03-26 ENCOUNTER — Encounter: Payer: Self-pay | Admitting: *Deleted

## 2013-04-19 ENCOUNTER — Ambulatory Visit (INDEPENDENT_AMBULATORY_CARE_PROVIDER_SITE_OTHER): Payer: Medicare Other | Admitting: Family Medicine

## 2013-04-19 ENCOUNTER — Encounter: Payer: Self-pay | Admitting: Family Medicine

## 2013-04-19 VITALS — BP 128/78 | HR 73 | Temp 98.0°F | Ht <= 58 in | Wt 118.5 lb

## 2013-04-19 DIAGNOSIS — E2839 Other primary ovarian failure: Secondary | ICD-10-CM

## 2013-04-19 DIAGNOSIS — R413 Other amnesia: Secondary | ICD-10-CM

## 2013-04-19 DIAGNOSIS — Z23 Encounter for immunization: Secondary | ICD-10-CM

## 2013-04-19 DIAGNOSIS — E039 Hypothyroidism, unspecified: Secondary | ICD-10-CM

## 2013-04-19 DIAGNOSIS — E209 Hypoparathyroidism, unspecified: Secondary | ICD-10-CM

## 2013-04-19 MED ORDER — DONEPEZIL HCL 10 MG PO TBDP: 10.0000 mg | ORAL_TABLET | Freq: Every day | ORAL | Status: DC

## 2013-04-19 MED ORDER — LOSARTAN POTASSIUM 100 MG PO TABS: 100.0000 mg | ORAL_TABLET | Freq: Every day | ORAL | Status: DC

## 2013-04-19 NOTE — Progress Notes (Signed)
Subjective:    Patient ID: Gloria Parker, female    DOB: 1940-12-19, 72 y.o.   MRN: [id number]  HPI Here for f/u of memory problems   She followed up with Dr Evlyn Kanner - her thyroid globulin level went down - and adj her synthroid  Has f/u in Nov  Suggested getting bone density test   Had colonoscopy scheduled   Doing ok with aricept so far  No further memory loss -no progression noted  No side eff No nausea  No sleep change  Is ok with inc the dose  Wants to get flu shot today  Patient Active Problem List   Diagnosis Date Noted  . Estrogen deficiency 03/20/2013  . Encounter for Medicare annual wellness exam 03/12/2013  . Personal history of colonic polyps 03/12/2013  . Memory loss 02/20/2013  . Other screening mammogram 02/17/2012  . Depression (emotion) 09/09/2011  . 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 SHOULDER PAIN, RIGHT 09/14/2010  . POSTHERPETIC NEURALGIA 07/08/2010  . TREMOR, ESSENTIAL, RIGHT HAND 05/10/2010  . GRIEF REACTION, ACUTE 02/12/2010  . ADENOCARCINOMA, THYROID GLAND 08/21/2009  . POSTMENOPAUSAL STATUS 08/21/2009  . VERTIGO, HX OF 02/20/2009  . HYPOTHYROIDISM 01/06/2009  . HYPERLIPIDEMIA 01/06/2009  . STRESS REACTION, ACUTE, WITH EMOTIONAL DISTURBANCE 01/06/2009  . HYPERTENSION 01/06/2009  . 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 Degenerative disk disease     lumbar- with back pain (s/p injections)   . Vertigo 2007-2010  . Hearing loss     aide  . Hyperlipidemia   . Hypertension   . Colon polyps   . Breast cancer     caught very early- bx was curative, then 5 radiology treatments   . Adenocarcinoma     thyroid with thyroidectomy   . Hypothyroid     after thyroidectomy (Dr Evlyn Kanner)  . Hypoparathyroidism     surgical    Past Surgical History  Procedure Laterality Date  . Breast biopsy  1998    breast cancer   . Thyroidectomy  11/10    last part of thryoid/neck disection and laryngectomy  1/12  . Laryngectomy      for thyroid cancer   History  Substance Use Topics  . Smoking status: Never Smoker   . Smokeless tobacco: Never Used  . Alcohol Use: No   Family History  Problem Relation Age of Onset  . Alzheimer's disease Father    Allergies  Allergen Reactions  . Prednisone     REACTION: facial swelling   Current Outpatient Prescriptions on File Prior to Visit  Medication Sig Dispense Refill  . atorvastatin (LIPITOR) 80 MG tablet Take 80 mg by mouth daily.        . calcitRIOL (ROCALTROL) 0.5 MCG capsule Take 0.5 mcg by mouth daily.       . calcium-vitamin D (OSCAL WITH D) 500-200 MG-UNIT per tablet Take 10 tablets daily       . donepezil (ARICEPT) 5 MG tablet Take 1 tablet (5 mg total) by mouth at bedtime as needed.  30 tablet  3  . levothyroxine (SYNTHROID, LEVOTHROID) 112 MCG tablet Take 112 mcg by mouth. Takes Mon thru Sat daily, doesn't take on Sunday      . losartan (COZAAR) 100 MG tablet Take 1 tablet (100 mg total) by mouth daily.  90 tablet  1  . Multiple Vitamins-Iron (MULTIVITAMIN/IRON) TABS Take by mouth daily.        Marland Kitchen omeprazole (PRILOSEC) 20 MG capsule Take  20.5 mg by mouth daily.        No current facility-administered medications on file prior to visit.      Review of Systems Review of Systems  Constitutional: Negative for fever, appetite change, fatigue and unexpected weight change.  ENT pos for some post nasal drip with weather change Eyes: Negative for pain and visual disturbance.  Respiratory: Negative for cough and shortness of breath.   Cardiovascular: Negative for cp or palpitations    Gastrointestinal: Negative for nausea, diarrhea and constipation.  Genitourinary: Negative for urgency and frequency.  Skin: Negative for pallor or rash   Neurological: Negative for weakness, light-headedness, numbness and headaches.  Hematological: Negative for adenopathy. Does not bruise/bleed easily.  Psychiatric/Behavioral: Negative for dysphoric  mood. The patient is not nervous/anxious.  pos for short term memory loss that is stable       Objective:   Physical Exam  Constitutional: She appears well-developed and well-nourished. No distress.  HENT:  Head: Normocephalic and atraumatic.  Mouth/Throat: Oropharynx is clear and moist.  Eyes: Conjunctivae and EOM are normal. Pupils are equal, round, and reactive to light. Right eye exhibits no discharge. Left eye exhibits no discharge. No scleral icterus.  Neck: Normal range of motion. Neck supple.  S/p thyroidectomy With trache  Cardiovascular: Normal rate and regular rhythm.   Pulmonary/Chest: Effort normal and breath sounds normal. No respiratory distress. She has no wheezes. She has no rales.  Musculoskeletal: She exhibits no edema.  No kyphosis  Lymphadenopathy:    She has no cervical adenopathy.  Neurological: She is alert. She has normal reflexes. She displays tremor.  Skin: Skin is warm and dry. No rash noted. No erythema. No pallor.  Psychiatric: She has a normal mood and affect.          Assessment & Plan:

## 2013-04-19 NOTE — Patient Instructions (Addendum)
Please stop up front for bone density referral (she wants earliest am appt avail) Flu vaccine today  Increase aricept to 10 mg and let me know if any problems for that  Follow up in about 6 months

## 2013-04-21 NOTE — Assessment & Plan Note (Signed)
Dr Evlyn Kanner following s/p thyroidecetomy

## 2013-04-21 NOTE — Assessment & Plan Note (Signed)
Has seen Dr Evlyn Kanner- following that and doing better

## 2013-04-21 NOTE — Assessment & Plan Note (Signed)
Pt has this as well as thyroid/ parathyroid issues - ordering dexa  She is also petite and of Asian descent

## 2013-04-21 NOTE — Assessment & Plan Note (Signed)
Will inc aricept to 10 mg daily - since she tolerates the 5 mg Will update if any side effects or problems

## 2013-04-23 ENCOUNTER — Ambulatory Visit (INDEPENDENT_AMBULATORY_CARE_PROVIDER_SITE_OTHER)
Admission: RE | Admit: 2013-04-23 | Discharge: 2013-04-23 | Disposition: A | Discharge status: Home / Self Care | Payer: Medicare Other | Source: Ambulatory Visit | Attending: Family Medicine | Admitting: Family Medicine

## 2013-04-23 DIAGNOSIS — E2839 Other primary ovarian failure: Secondary | ICD-10-CM

## 2013-04-23 DIAGNOSIS — E039 Hypothyroidism, unspecified: Secondary | ICD-10-CM

## 2013-04-23 DIAGNOSIS — E209 Hypoparathyroidism, unspecified: Secondary | ICD-10-CM

## 2013-04-23 LAB — HM DEXA SCAN: HM Dexa Scan: NORMAL

## 2013-05-05 ENCOUNTER — Emergency Department (INDEPENDENT_AMBULATORY_CARE_PROVIDER_SITE_OTHER)
Admission: EM | Admit: 2013-05-05 | Discharge: 2013-05-05 | Disposition: A | Discharge status: Home / Self Care | Payer: Medicare Other | Source: Home / Self Care

## 2013-05-05 ENCOUNTER — Encounter (HOSPITAL_COMMUNITY): Payer: Self-pay | Admitting: *Deleted

## 2013-05-05 DIAGNOSIS — L259 Unspecified contact dermatitis, unspecified cause: Secondary | ICD-10-CM

## 2013-05-05 DIAGNOSIS — R21 Rash and other nonspecific skin eruption: Secondary | ICD-10-CM

## 2013-05-05 MED ORDER — HYDROCORTISONE 1 % EX LOTN: TOPICAL_LOTION | Freq: Two times a day (BID) | CUTANEOUS | Status: DC

## 2013-05-05 NOTE — ED Notes (Signed)
Pt  Reports  Red  Rash  r  Side  Face      X  2  Days     The  Rash        Itches  At      No  Pain  Feels    Warm  To  Touch          She  Also  Has  A  Rash  On          r  Side         Of  Face    As  Well

## 2013-05-05 NOTE — ED Provider Notes (Signed)
CSN: [id number]     Arrival date & time 05/05/13  1137 History   None    Chief Complaint  Patient presents with  . Rash   (Consider location/radiation/quality/duration/timing/severity/associated sxs/prior Treatment) HPI Comments: Patient reports an onset of right facial rash that began on Thursday night after working in the yard. It has now spread to the right face as well. It is pruritic and non-painful. No known exposures. No fever or chills. No pain or swelling is noted. No drainage from the nearby tracheostomy. Otherwise feels well. Her [facility], and it the extender sent a picture of the rash to her supervising physician who was concerned it was from a cellulitis, and asked that she come here for an evaluation.  Patient is a 72 y.o. female presenting with rash. The history is provided by the patient and a caregiver.  Rash Associated symptoms: no chills, no fatigue, no fever and no shortness of breath     Past Medical History  Diagnosis Date  . Degenerative disk disease     lumbar- with back pain (s/p injections)   . Vertigo 2007-2010  . Hearing loss     aide  . Hyperlipidemia   . Hypertension   . Colon polyps   . Breast cancer     caught very early- bx was curative, then 51 radiology treatments   . Adenocarcinoma     thyroid with thyroidectomy   . Hypothyroid     after thyroidectomy (Dr Evlyn Kanner)  . Hypoparathyroidism     surgical    Past Surgical History  Procedure Laterality Date  . Breast biopsy  1998    breast cancer   . Thyroidectomy  11/10    last part of thryoid/neck disection and laryngectomy 1/12  . Laryngectomy      for thyroid cancer   Family History  Problem Relation Age of Onset  . Alzheimer's disease Father    History  Substance Use Topics  . Smoking status: Never Smoker   . Smokeless tobacco: Never Used  . Alcohol Use: No   OB History   Grav Para Term Preterm Abortions TAB SAB Ect Mult Living                  Review of Systems  Constitutional: Negative for fever, chills and fatigue.  HENT: Negative for facial swelling.   Respiratory: Negative for shortness of breath and wheezing.   Cardiovascular: Negative.   Skin: Positive for rash.  All other systems reviewed and are negative.    Allergies  Prednisone  Home Medications   Current Outpatient Rx  Name  Route  Sig  Dispense  Refill  . atorvastatin (LIPITOR) 80 MG tablet   Oral   Take 80 mg by mouth daily.           . calcitRIOL (ROCALTROL) 0.5 MCG capsule   Oral   Take 0.5 mcg by mouth daily.          . calcium-vitamin D (OSCAL WITH D) 500-200 MG-UNIT per tablet      Take 10 tablets daily          . donepezil (ARICEPT ODT) 10 MG disintegrating tablet   Oral   Take 1 tablet (10 mg total) by mouth at bedtime.   30 tablet   11   . hydrocortisone 1 % lotion   Topical   Apply topically 2 (two) times daily.   118 mL   0   .  levothyroxine (SYNTHROID, LEVOTHROID) 112 MCG tablet   Oral   Take 112 mcg by mouth. Takes Mon thru Sat daily, doesn't take on Sunday         . losartan (COZAAR) 100 MG tablet   Oral   Take 1 tablet (100 mg total) by mouth daily.   90 tablet   3   . Multiple Vitamins-Iron (MULTIVITAMIN/IRON) TABS   Oral   Take by mouth daily.           Marland Kitchen omeprazole (PRILOSEC) 20 MG capsule   Oral   Take 20.5 mg by mouth daily.           BP 120/78  Pulse 78  Temp(Src) 98.6 F (37 C) (Oral)  Resp 16  SpO2 100% Physical Exam  Nursing note and vitals reviewed. Constitutional: She is oriented to person, place, and time. She appears well-developed and well-nourished. No distress.  HENT:  Head: Normocephalic and atraumatic.  Eyes: Pupils are equal, round, and reactive to light.  Neurological: She is alert and oriented to person, place, and time.  Skin: Skin is warm and dry.  Right erythematous rash to the right face line and small amount on the left cheek. No warmth. No drainage. Excoriations  are noted.     ED Course  Procedures (including critical care time) Labs Review Labs Reviewed - No data to display Imaging Review No results found.  MDM   1. Rash and nonspecific skin eruption   2. Contact dermatitis    Dr. Lorenz Coaster was also in to see and evaluate patient today. We agree no evidence to suggest an underlying cellulitis but a contact dermatitis. Therefore will treat with topicals and antihistamine.  If worsens or fevers return for re evaluation.    Azucena Fallen, PA-C 05/05/13 1320

## 2013-05-05 NOTE — ED Provider Notes (Signed)
Medical screening examination/treatment/procedure(s) were performed by non-physician practitioner and as supervising physician I was immediately available for consultation/collaboration.  Leslee Home, M.D.   Reuben Likes, MD 05/05/13 [phone]

## 2013-05-06 ENCOUNTER — Encounter: Payer: Self-pay | Admitting: Family Medicine

## 2013-05-06 ENCOUNTER — Encounter: Payer: Self-pay | Admitting: *Deleted

## 2013-06-07 ENCOUNTER — Ambulatory Visit (AMBULATORY_SURGERY_CENTER): Payer: Self-pay | Admitting: *Deleted

## 2013-06-07 ENCOUNTER — Telehealth: Payer: Self-pay | Admitting: *Deleted

## 2013-06-07 VITALS — Ht 60.0 in | Wt 116.0 lb

## 2013-06-07 DIAGNOSIS — Z86010 Personal history of colonic polyps: Secondary | ICD-10-CM

## 2013-06-07 MED ORDER — MOVIPREP 100 G PO SOLR: ORAL | Status: DC

## 2013-06-07 NOTE — Progress Notes (Signed)
No allergies to eggs or soy. No problems with anesthesia.  

## 2013-06-07 NOTE — Telephone Encounter (Signed)
Dr Gloria Parker: pt is scheduled for recall colon Friday 06/21/2013.  Hx of polyps.  Last colon 2009 in [facility].  Pt had thyroid cancer in 2012 with laryngectomy and has tracheostomy. Does she need OV before scheduling colon at [facility] or is she okay for direct hospital colon?  Thanks, Jaymes Graff:  Pt was given general prep instructions in PV and will only need updated date and times.  Please communicate with son [name] instead of pt because she has trouble talking on the phone.  His phone number is: [phone]

## 2013-06-07 NOTE — Telephone Encounter (Signed)
Gloria Parker,  Patient needs to be done at hospital. Schedule at [facility] with MAC/propofol my next hospital week. Remove from [facility] schedule 06-21-13. No OV needed if patient is clinically stable.

## 2013-06-10 ENCOUNTER — Other Ambulatory Visit: Payer: Self-pay

## 2013-06-10 DIAGNOSIS — Z1211 Encounter for screening for malignant neoplasm of colon: Secondary | ICD-10-CM

## 2013-06-10 NOTE — Telephone Encounter (Signed)
Pt scheduled for Colon with mac at [facility] 07/09/13. Pt to arrive at [facility] at 10:15am for an appt at 11:45am. Left message for pts son to call back.  Spoke with pts son and he is aware of appt date and time. Updated prep instructions mailed to pt.

## 2013-06-10 NOTE — Telephone Encounter (Signed)
FYI

## 2013-06-17 ENCOUNTER — Telehealth: Payer: Self-pay

## 2013-06-17 ENCOUNTER — Encounter (HOSPITAL_COMMUNITY): Payer: Self-pay | Admitting: *Deleted

## 2013-06-17 ENCOUNTER — Encounter (HOSPITAL_COMMUNITY): Payer: Self-pay | Admitting: Pharmacy Technician

## 2013-06-17 NOTE — Telephone Encounter (Signed)
Pt's son left v/m requesting name of opthamologist Dr Milinda Antis would recommend; pt has had eye pain for one week. [name] does not need referral just a name of opthamologist that [name] could contact.

## 2013-06-17 NOTE — Telephone Encounter (Signed)
I like Dr Elmer Picker

## 2013-06-17 NOTE — Telephone Encounter (Signed)
Pt's son notified

## 2013-06-21 ENCOUNTER — Encounter: Payer: Medicare Other | Admitting: Internal Medicine

## 2013-07-09 ENCOUNTER — Encounter (HOSPITAL_COMMUNITY)
Admission: RE | Disposition: A | Discharge status: Home / Self Care | Payer: Self-pay | Source: Ambulatory Visit | Attending: Internal Medicine

## 2013-07-09 ENCOUNTER — Ambulatory Visit (HOSPITAL_COMMUNITY): Payer: Medicare Other | Admitting: Certified Registered Nurse Anesthetist

## 2013-07-09 ENCOUNTER — Encounter (HOSPITAL_COMMUNITY): Payer: Medicare Other | Admitting: Certified Registered Nurse Anesthetist

## 2013-07-09 ENCOUNTER — Encounter (HOSPITAL_COMMUNITY): Payer: Self-pay | Admitting: *Deleted

## 2013-07-09 ENCOUNTER — Ambulatory Visit (HOSPITAL_COMMUNITY)
Admission: RE | Admit: 2013-07-09 | Discharge: 2013-07-09 | Disposition: A | Discharge status: Home / Self Care | Payer: Medicare Other | Source: Ambulatory Visit | Attending: Internal Medicine | Admitting: Internal Medicine

## 2013-07-09 DIAGNOSIS — E890 Postprocedural hypothyroidism: Secondary | ICD-10-CM | POA: Insufficient documentation

## 2013-07-09 DIAGNOSIS — Z86010 Personal history of colonic polyps: Secondary | ICD-10-CM

## 2013-07-09 DIAGNOSIS — D126 Benign neoplasm of colon, unspecified: Secondary | ICD-10-CM | POA: Insufficient documentation

## 2013-07-09 DIAGNOSIS — Z1211 Encounter for screening for malignant neoplasm of colon: Secondary | ICD-10-CM

## 2013-07-09 DIAGNOSIS — Z853 Personal history of malignant neoplasm of breast: Secondary | ICD-10-CM | POA: Insufficient documentation

## 2013-07-09 DIAGNOSIS — Z85850 Personal history of malignant neoplasm of thyroid: Secondary | ICD-10-CM | POA: Insufficient documentation

## 2013-07-09 DIAGNOSIS — I10 Essential (primary) hypertension: Secondary | ICD-10-CM | POA: Insufficient documentation

## 2013-07-09 DIAGNOSIS — E785 Hyperlipidemia, unspecified: Secondary | ICD-10-CM | POA: Insufficient documentation

## 2013-07-09 DIAGNOSIS — E209 Hypoparathyroidism, unspecified: Secondary | ICD-10-CM | POA: Insufficient documentation

## 2013-07-09 HISTORY — PX: COLONOSCOPY: SHX5424

## 2013-07-09 SURGERY — COLONOSCOPY
Anesthesia: Monitor Anesthesia Care

## 2013-07-09 MED ORDER — KETAMINE HCL 50 MG/ML IJ SOLN
INTRAMUSCULAR | Status: DC | PRN
  Administered 2013-07-09: 25 mg via INTRAMUSCULAR

## 2013-07-09 MED ORDER — PROMETHAZINE HCL 25 MG/ML IJ SOLN: 6.2500 mg | INTRAMUSCULAR | Status: DC | PRN

## 2013-07-09 MED ORDER — SODIUM CHLORIDE 0.9 % IV SOLN: INTRAVENOUS | Status: DC

## 2013-07-09 MED ORDER — PROPOFOL 10 MG/ML IV BOLUS
INTRAVENOUS | Status: AC
  Filled 2013-07-09: qty 20

## 2013-07-09 MED ORDER — LACTATED RINGERS IV SOLN
INTRAVENOUS | Status: DC
  Administered 2013-07-09: 1000 mL via INTRAVENOUS

## 2013-07-09 MED ORDER — LIDOCAINE HCL (CARDIAC) 20 MG/ML IV SOLN
INTRAVENOUS | Status: DC | PRN
  Administered 2013-07-09: 40 mg via INTRAVENOUS

## 2013-07-09 MED ORDER — PROPOFOL INFUSION 10 MG/ML OPTIME
INTRAVENOUS | Status: DC | PRN
  Administered 2013-07-09: 25 ug/kg/min via INTRAVENOUS

## 2013-07-09 MED ORDER — PROPOFOL 10 MG/ML IV BOLUS
INTRAVENOUS | Status: DC | PRN
  Administered 2013-07-09 (×2): 10 mg via INTRAVENOUS

## 2013-07-09 NOTE — Op Note (Signed)
[facility] [address] [place], [address]   COLONOSCOPY PROCEDURE REPORT  PATIENT: Gloria Parker, Gloria Parker  MR#: [id number] BIRTHDATE: 09/10/1940 , 72  yrs. old GENDER: Female ENDOSCOPIST: Roxy Cedar, MD REFERRED [id number] Program Recall PROCEDURE DATE:  07/09/2013 PROCEDURE:   Colonoscopy with snare polypectomy x 2 First Screening Colonoscopy - Avg.  risk and is 50 yrs.  old or older - No.  Prior Negative Screening - Now for repeat screening. N/A  History of Adenoma - Now for follow-up colonoscopy & has been > or = to 3 yrs.  Yes hx of adenoma.  Has been 3 or more years since last colonoscopy.  Polyps Removed Today? Yes. ASA CLASS:   Class III INDICATIONS:Patient's personal history of adenomatous colon polyps. Index exam 2006 with small tubular adenomas. Most recent exam November 2009 with small tubular adenoma MEDICATIONS: MAC sedation, administered by CRNA and See Anesthesia Report.  DESCRIPTION OF PROCEDURE:   After the risks benefits and alternatives of the procedure were thoroughly explained, informed consent was obtained.  A digital rectal exam revealed no abnormalities of the rectum.   The EC-[id number] ([id number])  endoscope was introduced through the anus and advanced to the cecum, which was identified by both the appendix and ileocecal valve. No adverse events experienced.   The quality of the prep was good, using MoviPrep  The instrument was then slowly withdrawn as the colon was fully examined.  COLON FINDINGS: Two sessile polyps ranging between 5-64mm in size were found at the cecum and in the ascending colon.  A polypectomy was performed with a cold snare.  The resection was complete and the polyp tissue was completely retrieved.   The colon mucosa was otherwise normal.  Retroflexed views revealed no abnormalities. The time to cecum=5 minutes 0 seconds.  Withdrawal time=15 minutes 0 seconds.  The scope was withdrawn and the procedure  completed. COMPLICATIONS: There were no complications.  ENDOSCOPIC IMPRESSION: 1.   Two sessile polyps  were found at the cecum and in the ascending colon; polypectomy was performed with a cold snare 2.   The colon mucosa was otherwise normal  RECOMMENDATIONS: 1.Repeat colonoscopy in 5 years if polyp adenomatous; otherwise no routine followup planned   eSigned:  Roxy Cedar, MD 07/09/2013 12:55 PM   cc: Judy Pimple, MD and The Patient

## 2013-07-09 NOTE — H&P (Signed)
  HISTORY OF PRESENT ILLNESS:  Gloria Parker is a 72 y.o. female with a history of adenomatous colon polyps in 2006 and most recently November 2009. Due for routine surveillance colonoscopy. Due to interval head and neck surgery, MAC/anesthesia supervised propofol at the hospital arranged. No active GI symptoms. Tolerated prep  REVIEW OF SYSTEMS:  All non-GI ROS negative   Past Medical History  Diagnosis Date  . Vertigo 2007-2010  . Hearing loss     aide  . Hyperlipidemia   . Hypertension   . Colon polyps   . Hypothyroid     after thyroidectomy (Dr Evlyn Kanner)  . Hypoparathyroidism     surgical   . Breast cancer 1997    caught very early- bx was curative, then 11 radiology treatments   . 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 Degenerative disk disease none in last 5 years    lumbar- with back pain (s/p injections)     Past Surgical History  Procedure Laterality Date  . Thyroidectomy  2009 and 2011    last part of thryoid/neck disection and laryngectomy 1/12  . Laryngectomy  2012    for thyroid cancer  . Breast biopsy Left 1998    breast cancer     Social History Gloria Parker  reports that she has never smoked. She has never used smokeless tobacco. She reports that she does not drink alcohol or use illicit drugs.  family history includes Alzheimer's disease in her father. There is no history of Colon cancer.  Allergies  Allergen Reactions  . Prednisone     REACTION: facial swelling       PHYSICAL EXAMINATION: Vital signs: BP 133/99  Pulse 88  Temp(Src) 98.2 F (36.8 C) (Oral)  Resp 17  Ht 5\' 1"  (1.549 m)  Wt 116 lb (52.617 kg)  BMI 21.93 kg/m2  SpO2 99%  Constitutional: generally well-appearing, no acute distress Psychiatric: alert and oriented x3, cooperative Eyes: extraocular movements intact, anicteric, conjunctiva pink Mouth: oral pharynx moist, no lesions Neck: supple no lymphadenopathy Cardiovascular: heart regular  rate and rhythm, no murmur Lungs: clear to auscultation bilaterally Abdomen: soft, nontender, nondistended, no obvious ascites, no peritoneal signs, normal bowel sounds, no organomegaly Rectal: See colonoscopy report Extremities: no lower extremity edema bilaterally Skin: no lesions on visible extremities Neuro: No focal deficits.   ASSESSMENT:  #1. History of adenomatous colon polyps. Due for surveillance #2. Multiple medical problems. Stable  PLAN:  1. Surveillance colonoscopy.The nature of the procedure, as well as the risks, benefits, and alternatives were carefully and thoroughly reviewed with the patient. Ample time for discussion and questions allowed. The patient understood, was satisfied, and agreed to proceed.

## 2013-07-09 NOTE — Anesthesia Postprocedure Evaluation (Signed)
  Anesthesia Post-op Note  Patient: Gloria Parker  Procedure(s) Performed: Procedure(s) (LRB): COLONOSCOPY (N/A)  Patient Location: PACU  Anesthesia Type: MAC  Level of Consciousness: awake and alert   Airway and Oxygen Therapy: Patient Spontanous Breathing  Post-op Pain: mild  Post-op Assessment: Post-op Vital signs reviewed, Patient's Cardiovascular Status Stable, Respiratory Function Stable, Patent Airway and No signs of Nausea or vomiting  Last Vitals:  Filed Vitals:   07/09/13 1047  BP: 133/99  Pulse: 88  Temp: 36.8 C  Resp: 17    Post-op Vital Signs: stable   Complications: No apparent anesthesia complications

## 2013-07-09 NOTE — Transfer of Care (Signed)
Immediate Anesthesia Transfer of Care Note  Patient: Gloria Parker  Procedure(s) Performed: Procedure(s) (LRB): COLONOSCOPY (N/A)  Patient Location: PACU  Anesthesia Type: MAC  Level of Consciousness: sedated, patient cooperative and responds to stimulation  Airway & Oxygen Therapy: Patient Spontanous Breathing and Patient connected to face mask oxgen  Post-op Assessment: Report given to PACU RN and Post -op Vital signs reviewed and stable  Post vital signs: Reviewed and stable  Complications: No apparent anesthesia complications

## 2013-07-09 NOTE — Preoperative (Signed)
Beta Blockers   Reason not to administer Beta Blockers:Not Applicable 

## 2013-07-09 NOTE — Anesthesia Preprocedure Evaluation (Addendum)
Anesthesia Evaluation  Patient identified by MRN, date of birth, ID band Patient awake    Reviewed: Allergy & Precautions, H&P , NPO status , Patient's Chart, lab work & pertinent test results  Airway Mallampati: II TM Distance: >3 FB Neck ROM: Full    Dental no notable dental hx.    Pulmonary neg pulmonary ROS,  breath sounds clear to auscultation  Pulmonary exam normal       Cardiovascular hypertension, Pt. on medications Rhythm:Regular Rate:Normal     Neuro/Psych negative neurological ROS  negative psych ROS   GI/Hepatic Neg liver ROS, GERD-  Medicated,  Endo/Other  Hypothyroidism   Renal/GU negative Renal ROS  negative genitourinary   Musculoskeletal negative musculoskeletal ROS (+)   Abdominal   Peds negative pediatric ROS (+)  Hematology negative hematology ROS (+)   Anesthesia Other Findings   Reproductive/Obstetrics negative OB ROS                          Anesthesia Physical Anesthesia Plan  ASA: II  Anesthesia Plan: MAC   Post-op Pain Management:    Induction: Intravenous  Airway Management Planned: Nasal Cannula  Additional Equipment:   Intra-op Plan:   Post-operative Plan:   Informed Consent: I have reviewed the patients History and Physical, chart, labs and discussed the procedure including the risks, benefits and alternatives for the proposed anesthesia with the patient or authorized representative who has indicated his/her understanding and acceptance.   Dental advisory given  Plan Discussed with: CRNA and Surgeon  Anesthesia Plan Comments:         Anesthesia Quick Evaluation

## 2013-07-10 ENCOUNTER — Encounter: Payer: Self-pay | Admitting: Internal Medicine

## 2013-07-10 ENCOUNTER — Encounter (HOSPITAL_COMMUNITY): Payer: Self-pay | Admitting: Internal Medicine

## 2013-10-21 ENCOUNTER — Telehealth: Payer: Self-pay | Admitting: Family Medicine

## 2013-10-21 NOTE — Telephone Encounter (Signed)
Attempted call back and NA, LM to call back to office number-[phone]

## 2013-10-25 ENCOUNTER — Telehealth: Payer: Self-pay

## 2013-10-25 DIAGNOSIS — C73 Malignant neoplasm of thyroid gland: Secondary | ICD-10-CM

## 2013-10-25 DIAGNOSIS — R413 Other amnesia: Secondary | ICD-10-CM

## 2013-10-25 DIAGNOSIS — R519 Headache, unspecified: Secondary | ICD-10-CM

## 2013-10-25 DIAGNOSIS — R51 Headache: Principal | ICD-10-CM

## 2013-10-25 NOTE — Telephone Encounter (Signed)
pts son Fara Olden said last time pt was seen was discussed she might need CT scan re: to memory loss. Fara Olden said for last 2 months pt has headaches at least twice a week. No dizziness or blurred vision. Fara Olden request CT to be scheduled without pt coming in for another appt. Fara Olden request cb.

## 2013-10-25 NOTE — Telephone Encounter (Signed)
I agree and will go forward with CT order -they will be getting a call  Marion-let me know if she will need and bun/ cr for this -last was done in the summer I think

## 2013-10-25 NOTE — Telephone Encounter (Signed)
appt scheduled for Monday and [facility] aware

## 2013-10-25 NOTE — Telephone Encounter (Signed)
Please set her up for lab for BUN and CR for upcoming CT scan, thanks

## 2013-10-25 NOTE — Telephone Encounter (Signed)
Spoke with Rosaria Ferries and pt will needs labs done before the CT  Left voicemail letting pt's son know that referral done and Marion/Linda will call to schedule appt but pt will needs labs done before CT so requested pt's son to call and schedule a lab appt  Please put lab orders in

## 2013-10-28 ENCOUNTER — Other Ambulatory Visit (INDEPENDENT_AMBULATORY_CARE_PROVIDER_SITE_OTHER): Payer: Medicare Other

## 2013-10-28 DIAGNOSIS — C73 Malignant neoplasm of thyroid gland: Secondary | ICD-10-CM

## 2013-10-28 DIAGNOSIS — R413 Other amnesia: Secondary | ICD-10-CM

## 2013-10-28 LAB — CREATININE, SERUM: Creatinine, Ser: 0.8 mg/dL (ref 0.4–1.2)

## 2013-10-28 LAB — BUN: BUN: 19 mg/dL (ref 6–23)

## 2013-10-30 ENCOUNTER — Ambulatory Visit (INDEPENDENT_AMBULATORY_CARE_PROVIDER_SITE_OTHER)
Admission: RE | Admit: 2013-10-30 | Discharge: 2013-10-30 | Disposition: A | Discharge status: Home / Self Care | Payer: Medicare Other | Source: Ambulatory Visit | Attending: Family Medicine | Admitting: Family Medicine

## 2013-10-30 DIAGNOSIS — R413 Other amnesia: Secondary | ICD-10-CM

## 2013-10-30 DIAGNOSIS — C73 Malignant neoplasm of thyroid gland: Secondary | ICD-10-CM

## 2013-10-30 DIAGNOSIS — R51 Headache: Secondary | ICD-10-CM

## 2013-10-30 DIAGNOSIS — R519 Headache, unspecified: Secondary | ICD-10-CM

## 2013-10-30 MED ORDER — IOHEXOL 300 MG/ML  SOLN
80.0000 mL | Freq: Once | INTRAMUSCULAR | Status: AC | PRN
  Administered 2013-10-30: 80 mL via INTRAVENOUS

## 2014-02-14 ENCOUNTER — Encounter (HOSPITAL_COMMUNITY): Payer: Self-pay | Admitting: Emergency Medicine

## 2014-02-14 ENCOUNTER — Emergency Department (HOSPITAL_COMMUNITY)
Admission: EM | Admit: 2014-02-14 | Discharge: 2014-02-14 | Disposition: A | Discharge status: Home / Self Care | Payer: Medicare Other | Attending: Emergency Medicine | Admitting: Emergency Medicine

## 2014-02-14 DIAGNOSIS — I10 Essential (primary) hypertension: Secondary | ICD-10-CM | POA: Insufficient documentation

## 2014-02-14 DIAGNOSIS — E039 Hypothyroidism, unspecified: Secondary | ICD-10-CM | POA: Diagnosis not present

## 2014-02-14 DIAGNOSIS — H109 Unspecified conjunctivitis: Secondary | ICD-10-CM

## 2014-02-14 DIAGNOSIS — L03213 Periorbital cellulitis: Secondary | ICD-10-CM

## 2014-02-14 DIAGNOSIS — H5710 Ocular pain, unspecified eye: Secondary | ICD-10-CM | POA: Diagnosis present

## 2014-02-14 DIAGNOSIS — Z9889 Other specified postprocedural states: Secondary | ICD-10-CM | POA: Insufficient documentation

## 2014-02-14 DIAGNOSIS — E785 Hyperlipidemia, unspecified: Secondary | ICD-10-CM | POA: Insufficient documentation

## 2014-02-14 DIAGNOSIS — Z853 Personal history of malignant neoplasm of breast: Secondary | ICD-10-CM | POA: Insufficient documentation

## 2014-02-14 DIAGNOSIS — H05019 Cellulitis of unspecified orbit: Secondary | ICD-10-CM | POA: Diagnosis not present

## 2014-02-14 DIAGNOSIS — Z85850 Personal history of malignant neoplasm of thyroid: Secondary | ICD-10-CM | POA: Diagnosis not present

## 2014-02-14 DIAGNOSIS — Z8739 Personal history of other diseases of the musculoskeletal system and connective tissue: Secondary | ICD-10-CM | POA: Diagnosis not present

## 2014-02-14 DIAGNOSIS — Z860100 Personal history of colon polyps, unspecified: Secondary | ICD-10-CM | POA: Insufficient documentation

## 2014-02-14 DIAGNOSIS — Z79899 Other long term (current) drug therapy: Secondary | ICD-10-CM | POA: Diagnosis not present

## 2014-02-14 MED ORDER — FLUORESCEIN SODIUM 1 MG OP STRP
1.0000 | ORAL_STRIP | Freq: Once | OPHTHALMIC | Status: AC
  Administered 2014-02-14: 1 via OPHTHALMIC
  Filled 2014-02-14: qty 1

## 2014-02-14 MED ORDER — CLINDAMYCIN HCL 150 MG PO CAPS: 450.0000 mg | ORAL_CAPSULE | Freq: Three times a day (TID) | ORAL | Status: DC

## 2014-02-14 MED ORDER — TETRACAINE HCL 0.5 % OP SOLN
2.0000 [drp] | Freq: Once | OPHTHALMIC | Status: AC
  Administered 2014-02-14: 2 [drp] via OPHTHALMIC
  Filled 2014-02-14: qty 2

## 2014-02-14 MED ORDER — CIPROFLOXACIN HCL 0.3 % OP SOLN
1.0000 [drp] | OPHTHALMIC | Status: DC
Start: 2014-02-14 — End: 2014-03-18

## 2014-02-14 NOTE — ED Notes (Signed)
Rt eye painful and swollen since this am.  She woke up this way.  Both eyelids swollen almost together.  No ithcing

## 2014-02-14 NOTE — ED Notes (Signed)
Pt st's right eye started feeling irritated 2 days ago st's this am was unable to open eye due to drainage.  Now left eye has small amount of drainage.

## 2014-02-14 NOTE — ED Provider Notes (Signed)
CSN: [id number]     Arrival date & time 02/14/14  1949 History  This chart was scribed for a non-physician practitioner, Abigail Butts PA-C, working with Raquel Sarna, MD, by Cathie Hoops, ED Scribe. The patient was seen in TR04C/TR04C. The patient's care was started at 8:43 PM.    Chief Complaint  Patient presents with  . Eye Pain   Patient is a 73 y.o. female presenting with eye pain. The history is provided by the patient, medical records and a relative. No language interpreter was used.  Eye Pain This is a new problem. The current episode started 12 to 24 hours ago. The problem occurs constantly. The problem has been gradually worsening. Pertinent negatives include no chest pain, no abdominal pain, no headaches and no shortness of breath. Nothing aggravates the symptoms. Nothing relieves the symptoms. She has tried nothing for the symptoms.   HPI Comments: Gloria Parker is a 73 y.o. female who presents to the Emergency Department complaining of new, moderate and gradually worsening right eye pain onset 12 hours ago. Pt states her right eye was "bothering" her yesterday but her right eyelid was red and swollen this morning. She has had purulent drainage out of the eye all day.  Pt states her right eye feels painful. Pt states her pain was 10/10 when she woke up but has since decreased. Pt denies any injury to her eyes. Pt states she is having blurry vision bilaterally. Pt denies wearing contacts. Pt denies exposure to anyone who has eye pain or eye  infection. Pt states she has NO past medical history of diabetes. Pt states she sees an ophthalmologist at [facility] but wants to see a new doctor. Pt denies itching, fever, chills, nausea, or vomiting.  Past Medical History  Diagnosis Date  . Vertigo 2007-2010  . Hearing loss     aide  . Hyperlipidemia   . Hypertension   . Colon polyps   . Hypothyroid     after thyroidectomy (Dr Forde Dandy)  . Hypoparathyroidism      surgical   . Breast cancer 1997    caught very early- bx was curative, then 50 radiology treatments   . 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 Degenerative disk disease none in last 5 years    lumbar- with back pain (s/p injections)    Past Surgical History  Procedure Laterality Date  . Thyroidectomy  2009 and 2011    last part of thryoid/neck disection and laryngectomy 1/12  . Laryngectomy  2012    for thyroid cancer  . Breast biopsy Left 1998    breast cancer   . Colonoscopy N/A 07/09/2013    Procedure: COLONOSCOPY;  Surgeon: Irene Shipper, MD;  Location: [facility];  Service: Endoscopy;  Laterality: N/A;   Family History  Problem Relation Age of Onset  . Alzheimer's disease Father   . Colon cancer Neg Hx    History  Substance Use Topics  . Smoking status: Never Smoker   . Smokeless tobacco: Never Used  . Alcohol Use: No   OB History   Grav Para Term Preterm Abortions TAB SAB Ect Mult Living                 Review of Systems  Constitutional: Negative for fever, chills, diaphoresis, appetite change, fatigue and unexpected weight change.  HENT: Negative for mouth sores.   Eyes: Positive for pain (right), discharge (right eye) and visual disturbance (blurry  vision in right eye). Negative for itching.  Respiratory: Negative for cough, chest tightness, shortness of breath and wheezing.   Cardiovascular: Negative for chest pain.  Gastrointestinal: Negative for nausea, vomiting, abdominal pain, diarrhea and constipation.  Endocrine: Negative for polydipsia, polyphagia and polyuria.  Genitourinary: Negative for dysuria, urgency, frequency and hematuria.  Musculoskeletal: Negative for back pain and neck stiffness.  Skin: Negative for rash.  Allergic/Immunologic: Negative for immunocompromised state.  Neurological: Negative for syncope, light-headedness and headaches.  Hematological: Does not bruise/bleed easily.  Psychiatric/Behavioral: Negative for  sleep disturbance. The patient is not nervous/anxious.    Allergies  Prednisone  Home Medications   Prior to Admission medications   Medication Sig Start Date End Date Taking? Authorizing Provider  atorvastatin (LIPITOR) 80 MG tablet Take 80 mg by mouth every morning.    Yes Historical Provider, MD  calcitRIOL (ROCALTROL) 0.5 MCG capsule Take 0.5 mcg by mouth daily.    Yes Historical Provider, MD  calcium-vitamin D (OSCAL WITH D) 500-200 MG-UNIT per tablet Take 5 tablets by mouth 2 (two) times daily.    Yes Historical Provider, MD  donepezil (ARICEPT ODT) 10 MG disintegrating tablet Take 1 tablet (10 mg total) by mouth at bedtime. 04/19/13  Yes Abner Greenspan, MD  levothyroxine (SYNTHROID, LEVOTHROID) 112 MCG tablet Take 112 mcg by mouth See admin instructions. Takes Mon thru Fri daily, doesn't take on Saturday or Sunday   Yes Historical Provider, MD  losartan (COZAAR) 100 MG tablet Take 100 mg by mouth every morning.   Yes Historical Provider, MD  Multiple Vitamins-Iron (MULTIVITAMIN/IRON) TABS Take 1 tablet by mouth daily.    Yes Historical Provider, MD  omeprazole (PRILOSEC) 20 MG capsule Take 20 mg by mouth daily.    Yes Historical Provider, MD  ciprofloxacin (CILOXAN) 0.3 % ophthalmic solution Place 1 drop into both eyes every 3 (three) hours while awake. For 7 days 02/14/14   Jarrett Soho Leiann Sporer, PA-C  clindamycin (CLEOCIN) 150 MG capsule Take 3 capsules (450 mg total) by mouth 3 (three) times daily. 02/14/14   Ninamarie Keel, PA-C   Triage Vitals: BP 162/58  Pulse 63  Temp(Src) 98.2 F (36.8 C) (Oral)  Resp 18  Ht 5' (1.524 m)  Wt 108 lb 7 oz (49.187 kg)  BMI 21.18 kg/m2  SpO2 98% Physical Exam  Nursing note and vitals reviewed. Constitutional: She appears well-developed and well-nourished. No distress.  Awake, alert, nontoxic appearance  HENT:  Head: Normocephalic and atraumatic.  Mouth/Throat: Oropharynx is clear and moist. No oropharyngeal exudate.  Eyes: EOM are  normal. Pupils are equal, round, and reactive to light. Lids are everted and swept, no foreign bodies found. Right eye exhibits chemosis, discharge (purulent) and exudate. Right eye exhibits no hordeolum. No foreign body present in the right eye. Left eye exhibits no chemosis, no discharge, no exudate and no hordeolum. No foreign body present in the left eye. Right conjunctiva is injected. Right conjunctiva has no hemorrhage. Left conjunctiva is not injected. Left conjunctiva has no hemorrhage. No scleral icterus.  Fundoscopic exam:      The right eye shows no arteriolar narrowing, no AV nicking, no exudate and no hemorrhage.       The left eye shows no arteriolar narrowing, no AV nicking, no exudate and no hemorrhage.  Slit lamp exam:      The right eye shows no corneal abrasion, no corneal flare, no corneal ulcer, no foreign body, no fluorescein uptake and no anterior chamber bulge.  The left eye shows no corneal abrasion, no corneal flare, no corneal ulcer, no foreign body, no fluorescein uptake and no anterior chamber bulge.  Mild periorbital swelling with very mild erythema, no induration of the. All tissue No pain to palpation of the globe Significant hemostasis of the right eye, no ecchymosis of the left  Tono: R:19 L:16  Neck: Normal range of motion. Neck supple.  Cardiovascular: Normal rate, regular rhythm and intact distal pulses.   Pulmonary/Chest: Effort normal and breath sounds normal. No respiratory distress. She has no wheezes.  Musculoskeletal: Normal range of motion. She exhibits no edema.  Neurological: She is alert.  Speech is clear and goal oriented Moves extremities without ataxia  Skin: Skin is warm and dry. She is not diaphoretic.  Psychiatric: She has a normal mood and affect.    ED Course  Procedures (including critical care time) DIAGNOSTIC STUDIES: Oxygen Saturation is 98% on RA, normal by my interpretation.    COORDINATION OF CARE: 9:00 PM- Patient  informed of current plan for treatment and evaluation and agrees with plan at this time.  Labs Review Labs Reviewed - No data to display  Imaging Review No results found.   EKG Interpretation None      MDM   Final diagnoses:  Bacterial conjunctivitis  Preseptal cellulitis of right eye   Gloria Parker presents with symptoms consistent with bacterial conjunctivitis.  Patient presentation consistent with bacterial conjunctivitis.  Purulent discharge exam.  No corneal abrasions, entrapment, consensual photophobia, or dendritic staining with fluorescein study.  Presentation non-concerning for iritis, corneal abrasions, or HSV.  Possible mild. Cellulitis, no concern for orbital cellulitis. Patient will be given ciprofloxacin ophthalmic as well as clindamycin orally.  Personal hygiene and frequent handwashing discussed.  Patient advised to followup with ophthalmologist for reevaluation in several days.  Patient verbalizes understanding and is agreeable with discharge.  I have personally reviewed patient's vitals, nursing note and any pertinent labs or imaging.  At this time, it has been determined that no acute conditions requiring further emergency intervention. The patient/guardian have been advised of the diagnosis and plan. I reviewed all labs and imaging including any potential incidental findings. We have discussed signs and symptoms that warrant return to the ED, such as fever, chills, worsening eyesight, increasing pain.  Patient/guardian has voiced understanding and agreed to follow-up with the PCP or specialist in 3 days.  Vital signs are stable at discharge.   BP 162/58  Pulse 63  Temp(Src) 98.2 F (36.8 C) (Oral)  Resp 18  Ht 5' (1.524 m)  Wt 108 lb 7 oz (49.187 kg)  BMI 21.18 kg/m2  SpO2 98%    I personally performed the services described in this documentation, which was scribed in my presence. The recorded information has been reviewed and is  accurate.         Jarrett Soho Reyaansh Merlo, PA-C 02/14/14 2135

## 2014-02-14 NOTE — ED Provider Notes (Signed)
  Face-to-face evaluation   History: Swelling, redness, started today, without known trauma or causative factors. She has environmental allergies, but has not had any recent high itching. Nose, drainage, sneezing, or coughing. She denies fever  Physical exam: Alert, elderly, female, in mild discomfort. Right eye has mild, upper and lower lid swelling, and significant ecchymosis, without swelling or opacity of the cornea. Extraocular muscles are intact  Medical screening examination/treatment/procedure(s) were conducted as a shared visit with non-physician practitioner(s) and myself.  I personally evaluated the patient during the encounter  Richarda Blade, MD 02/15/14 [phone]

## 2014-02-14 NOTE — Discharge Instructions (Signed)
1. Medications: cipro eye drops, clindamycin tablets, usual home medications 2. Treatment: rest, drink plenty of fluids, wash your hands frequently, do not rub your eyes 3. Follow Up: Please followup with opthalmology in 3 days for recheck or return to the ED for worsening symptoms  Bacterial Conjunctivitis Bacterial conjunctivitis, commonly called pink eye, is an inflammation of the clear membrane that covers the white part of the eye (conjunctiva). The inflammation can also happen on the underside of the eyelids. The blood vessels in the conjunctiva become inflamed causing the eye to become red or pink. Bacterial conjunctivitis may spread easily from one eye to another and from person to person (contagious).  CAUSES  Bacterial conjunctivitis is caused by bacteria. The bacteria may come from your own skin, your upper respiratory tract, or from someone else with bacterial conjunctivitis. SYMPTOMS  The normally white color of the eye or the underside of the eyelid is usually pink or red. The pink eye is usually associated with irritation, tearing, and some sensitivity to light. Bacterial conjunctivitis is often associated with a thick, yellowish discharge from the eye. The discharge may turn into a crust on the eyelids overnight, which causes your eyelids to stick together. If a discharge is present, there may also be some blurred vision in the affected eye. DIAGNOSIS  Bacterial conjunctivitis is diagnosed by your caregiver through an eye exam and the symptoms that you report. Your caregiver looks for changes in the surface tissues of your eyes, which may point to the specific type of conjunctivitis. A sample of any discharge may be collected on a cotton-tip swab if you have a severe case of conjunctivitis, if your cornea is affected, or if you keep getting repeat infections that do not respond to treatment. The sample will be sent to a lab to see if the inflammation is caused by a bacterial infection and  to see if the infection will respond to antibiotic medicines. TREATMENT   Bacterial conjunctivitis is treated with antibiotics. Antibiotic eyedrops are most often used. However, antibiotic ointments are also available. Antibiotics pills are sometimes used. Artificial tears or eye washes may ease discomfort. HOME CARE INSTRUCTIONS   To ease discomfort, apply a cool, clean wash cloth to your eye for 10-20 minutes, 3-4 times a day.  Gently wipe away any drainage from your eye with a warm, wet washcloth or a cotton ball.  Wash your hands often with soap and water. Use paper towels to dry your hands.  Do not share towels or wash cloths. This may spread the infection.  Change or wash your pillow case every day.  You should not use eye makeup until the infection is gone.  Do not operate machinery or drive if your vision is blurred.  Stop using contacts lenses. Ask your caregiver how to sterilize or replace your contacts before using them again. This depends on the type of contact lenses that you use.  When applying medicine to the infected eye, do not touch the edge of your eyelid with the eyedrop bottle or ointment tube. SEEK IMMEDIATE MEDICAL CARE IF:   Your infection has not improved within 3 days after beginning treatment.  You had yellow discharge from your eye and it returns.  You have increased eye pain.  Your eye redness is spreading.  Your vision becomes blurred.  You have a fever or persistent symptoms for more than 2-3 days.  You have a fever and your symptoms suddenly get worse.  You have facial pain, redness, or  swelling. MAKE SURE YOU:   Understand these instructions.  Will watch your condition.  Will get help right away if you are not doing well or get worse. Document Released: 07/18/2005 Document Revised: 04/11/2012 Document Reviewed: 12/19/2011 [facility] Patient Information 2015 [place], [place]. This information is not intended to replace advice given to you  by your health care provider. Make sure you discuss any questions you have with your health care provider.

## 2014-02-19 ENCOUNTER — Telehealth: Payer: Self-pay | Admitting: *Deleted

## 2014-02-19 NOTE — Telephone Encounter (Signed)
Thanks for the update

## 2014-02-19 NOTE — Telephone Encounter (Signed)
Dr. Glori Bickers sent me a message saying "Please check in with Gloria Parker and make sure she got in with an ophthalmologist "  Called and left voicemail on Gloria Parker's son Fara Olden phone asking how Gloria Parker was doing and asked if she went to see an ophthalmologist.

## 2014-02-19 NOTE — Telephone Encounter (Signed)
Gloria Parker called this morning, states he took his mother to ER Friday night and was diagnosed with pink eye. Was given eye drops and antibiotics. Followed up Monday morning with Dr. Katy Fitch (ophtalmologist) and confirmed it was in fact pink eye. Pt is feeling better.

## 2014-02-21 ENCOUNTER — Other Ambulatory Visit: Payer: Self-pay

## 2014-02-21 DIAGNOSIS — Z853 Personal history of malignant neoplasm of breast: Secondary | ICD-10-CM

## 2014-02-21 DIAGNOSIS — Z9889 Other specified postprocedural states: Secondary | ICD-10-CM

## 2014-02-21 DIAGNOSIS — Z1231 Encounter for screening mammogram for malignant neoplasm of breast: Secondary | ICD-10-CM

## 2014-03-10 ENCOUNTER — Telehealth: Payer: Self-pay | Admitting: Family Medicine

## 2014-03-10 DIAGNOSIS — E209 Hypoparathyroidism, unspecified: Secondary | ICD-10-CM

## 2014-03-10 DIAGNOSIS — I10 Essential (primary) hypertension: Secondary | ICD-10-CM

## 2014-03-10 DIAGNOSIS — E785 Hyperlipidemia, unspecified: Secondary | ICD-10-CM

## 2014-03-10 NOTE — Telephone Encounter (Signed)
Message copied by Abner Greenspan on Mon Mar 10, 2014  3:38 PM ------      Message from: Ellamae Sia      Created: Mon Mar 03, 2014 11:39 AM      Regarding: Lab orders for Tuesday, 8.11.15       Patient is scheduled for CPX labs, please order future labs, Thanks , Terri       ------

## 2014-03-10 NOTE — Telephone Encounter (Signed)
Message copied by Abner Greenspan on Mon Mar 10, 2014  3:36 PM ------      Message from: Ellamae Sia      Created: Mon Mar 03, 2014 11:39 AM      Regarding: Lab orders for Tuesday, 8.11.15       Patient is scheduled for CPX labs, please order future labs, Thanks , Terri       ------

## 2014-03-11 ENCOUNTER — Other Ambulatory Visit (INDEPENDENT_AMBULATORY_CARE_PROVIDER_SITE_OTHER): Payer: Medicare Other

## 2014-03-11 DIAGNOSIS — E209 Hypoparathyroidism, unspecified: Secondary | ICD-10-CM

## 2014-03-11 DIAGNOSIS — E785 Hyperlipidemia, unspecified: Secondary | ICD-10-CM

## 2014-03-11 DIAGNOSIS — I10 Essential (primary) hypertension: Secondary | ICD-10-CM

## 2014-03-11 LAB — CBC WITH DIFFERENTIAL/PLATELET
Basophils Absolute: 0 10*3/uL (ref 0.0–0.1)
Basophils Relative: 0.4 % (ref 0.0–3.0)
EOS PCT: 2.2 % (ref 0.0–5.0)
Eosinophils Absolute: 0.1 10*3/uL (ref 0.0–0.7)
HCT: 37.3 % (ref 36.0–46.0)
Hemoglobin: 12.4 g/dL (ref 12.0–15.0)
Lymphocytes Relative: 26.6 % (ref 12.0–46.0)
Lymphs Abs: 1.4 10*3/uL (ref 0.7–4.0)
MCHC: 33.2 g/dL (ref 30.0–36.0)
MCV: 93.3 fl (ref 78.0–100.0)
MONO ABS: 0.3 10*3/uL (ref 0.1–1.0)
Monocytes Relative: 6.1 % (ref 3.0–12.0)
NEUTROS PCT: 64.7 % (ref 43.0–77.0)
Neutro Abs: 3.4 10*3/uL (ref 1.4–7.7)
PLATELETS: 317 10*3/uL (ref 150.0–400.0)
RBC: 4 Mil/uL (ref 3.87–5.11)
RDW: 14.3 % (ref 11.5–15.5)
WBC: 5.3 10*3/uL (ref 4.0–10.5)

## 2014-03-11 LAB — LIPID PANEL
CHOL/HDL RATIO: 3
Cholesterol: 151 mg/dL (ref 0–200)
HDL: 55.1 mg/dL (ref >39.00)
LDL CALC: 77 mg/dL (ref 0–99)
NONHDL: 95.9
Triglycerides: 95 mg/dL (ref 0.0–149.0)
VLDL: 19 mg/dL (ref 0.0–40.0)

## 2014-03-11 LAB — COMPREHENSIVE METABOLIC PANEL
ALK PHOS: 62 U/L (ref 39–117)
ALT: 19 U/L (ref 0–35)
AST: 18 U/L (ref 0–37)
Albumin: 3.6 g/dL (ref 3.5–5.2)
BUN: 15 mg/dL (ref 6–23)
CO2: 31 mEq/L (ref 19–32)
Calcium: 9.4 mg/dL (ref 8.4–10.5)
Chloride: 101 mEq/L (ref 96–112)
Creatinine, Ser: 0.9 mg/dL (ref 0.4–1.2)
GFR: 63.68 mL/min (ref >60.00)
Glucose, Bld: 95 mg/dL (ref 70–99)
POTASSIUM: 4.3 meq/L (ref 3.5–5.1)
SODIUM: 139 meq/L (ref 135–145)
TOTAL PROTEIN: 7.3 g/dL (ref 6.0–8.3)
Total Bilirubin: 0.6 mg/dL (ref 0.2–1.2)

## 2014-03-11 LAB — VITAMIN D 25 HYDROXY (VIT D DEFICIENCY, FRACTURES): VITD: 54.93 ng/mL (ref 30.00–100.00)

## 2014-03-18 ENCOUNTER — Ambulatory Visit (INDEPENDENT_AMBULATORY_CARE_PROVIDER_SITE_OTHER): Payer: Medicare Other | Admitting: Family Medicine

## 2014-03-18 ENCOUNTER — Encounter (INDEPENDENT_AMBULATORY_CARE_PROVIDER_SITE_OTHER): Payer: Self-pay

## 2014-03-18 ENCOUNTER — Encounter: Payer: Self-pay | Admitting: Family Medicine

## 2014-03-18 VITALS — BP 136/82 | HR 59 | Temp 98.3°F | Ht 58.25 in | Wt 107.5 lb

## 2014-03-18 DIAGNOSIS — I10 Essential (primary) hypertension: Secondary | ICD-10-CM

## 2014-03-18 DIAGNOSIS — R519 Headache, unspecified: Secondary | ICD-10-CM

## 2014-03-18 DIAGNOSIS — E785 Hyperlipidemia, unspecified: Secondary | ICD-10-CM

## 2014-03-18 DIAGNOSIS — Z23 Encounter for immunization: Secondary | ICD-10-CM

## 2014-03-18 DIAGNOSIS — R51 Headache: Secondary | ICD-10-CM

## 2014-03-18 DIAGNOSIS — R413 Other amnesia: Secondary | ICD-10-CM

## 2014-03-18 DIAGNOSIS — Z0000 Encounter for general adult medical examination without abnormal findings: Secondary | ICD-10-CM

## 2014-03-18 MED ORDER — DONEPEZIL HCL 10 MG PO TBDP: 10.0000 mg | ORAL_TABLET | Freq: Every day | ORAL | Status: DC

## 2014-03-18 NOTE — Assessment & Plan Note (Signed)
Good control with lipitor  Interested in change to crestor in the future- her son will disc further with Dr Forde Dandy Was concerned MB:[id number] to memory loss

## 2014-03-18 NOTE — Assessment & Plan Note (Signed)
bp in fair control at this time  BP Readings from Last 1 Encounters:  03/18/14 136/82   No changes needed Disc lifstyle change with low sodium diet and exercise  Labs reviewed  Enc low sodium diet

## 2014-03-18 NOTE — Patient Instructions (Signed)
prevnar vaccine today  (this is a pneumonia vaccine booster)  Please work on an advanced directive  Get your mammogram as planned Stop at check out for referral to neurology

## 2014-03-18 NOTE — Progress Notes (Signed)
Subjective:    Patient ID: Gloria Parker, female    DOB: 1941-05-08, 73 y.o.   MRN: [id number]  HPI I have personally reviewed the Medicare Annual Wellness questionnaire and have noted 1. The patient's medical and social history 2. Their use of alcohol, tobacco or illicit drugs 3. Their current medications and supplements 4. The patient's functional ability including ADL's, fall risks, home safety risks and hearing or visual             impairment. 5. Diet and physical activities 6. Evidence for depression or mood disorders  The patients weight, height, BMI have been recorded in the chart and visual acuity is per eye clinic.  I have made referrals, counseling and provided education to the patient based review of the above and I have provided the pt with a written personalized care plan for preventive services.  Wt is down 9lb since Dec but leveled off recently  This is her baseline- she is happy with it  Getting her appetite back   Had a bad eye eye infection - ED and opthy- abx and eye drops    See scanned forms.  Routine anticipatory guidance given to patient.  See health maintenance. Colon cancer screening 12/14 - up to date on that  Breast cancer screening 8/14 - is due/ has appt 8/31 already scheduled  Self breast exam-no lumps  Flu vaccine had 9/14 , will get it next mo  Tetanus vaccine 3/09 Pneumovax 7/13 - will get prevnar today Zoster vaccine 7/10  Advance directive-does not have one - given the handout to work on that  Cognitive function addressed- see scanned forms- and if abnormal then additional documentation follows. - still having memory problems  Is on aricept 10 mg now (tolerates it)- per her son now she forgets what happens the same day Does not want her to drive by herself because she gets lost , and also looses items like hearing aide  Short term memory is getting worse  Gets confused about things like traveling and getting ready  Left the oven and  stove on and has burned food  Is ready to have a neurology visit    Her headaches have not been as frequent - when she gets them they are at the top of her head  CT was ok  Is open to seeing neurology    [place] and [place] reviewed  Meds, vitals, and allergies reviewed.   ROS: See HPI.  Otherwise negative.    bp is stable today  No cp or palpitations or headaches or edema  No side effects to medicines  BP Readings from Last 3 Encounters:  03/18/14 136/82  02/14/14 144/68  07/09/13 107/54      D level is 54 Last sept had dexa   Hyperlipidemia  lipitor and diet  Lab Results  Component Value Date   CHOL 151 03/11/2014   CHOL 173 02/22/2013   CHOL 166 02/17/2012   Lab Results  Component Value Date   HDL 55.10 03/11/2014   HDL 62.50 02/22/2013   HDL 71.50 02/17/2012   Lab Results  Component Value Date   LDLCALC 77 03/11/2014   LDLCALC 93 02/22/2013   LDLCALC 78 02/17/2012   Lab Results  Component Value Date   TRIG 95.0 03/11/2014   TRIG 89.0 02/22/2013   TRIG 82.0 02/17/2012   Lab Results  Component Value Date   CHOLHDL 3 03/11/2014   CHOLHDL 3 02/22/2013   CHOLHDL 2 02/17/2012  Lab Results  Component Value Date   LDLDIRECT 161.1 08/19/2009   very well controlled    Thyroid/parathyroid  Saw Dr Forde Dandy in June - adj her synthroid / thyroglobulin is stable  Continues to watch her  Sees him every 6 months now   Patient Active Problem List   Diagnosis Date Noted  . Frequent headaches 10/25/2013  . Estrogen deficiency 03/20/2013  . Encounter for Medicare annual wellness exam 03/12/2013  . Personal history of colonic polyps 03/12/2013  . Memory loss 02/20/2013  . Other screening mammogram 02/17/2012  . Depression (emotion) 09/09/2011  . 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 SHOULDER PAIN, RIGHT 09/14/2010  . POSTHERPETIC NEURALGIA 07/08/2010  . TREMOR, ESSENTIAL, RIGHT HAND 05/10/2010  . GRIEF REACTION, ACUTE 02/12/2010  . ADENOCARCINOMA, THYROID GLAND 08/21/2009  .  POSTMENOPAUSAL STATUS 08/21/2009  . VERTIGO, HX OF 02/20/2009  . HYPOTHYROIDISM 01/06/2009  . HYPERLIPIDEMIA 01/06/2009  . STRESS REACTION, ACUTE, WITH EMOTIONAL DISTURBANCE 01/06/2009  . HYPERTENSION 01/06/2009  . 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 Vertigo 2007-2010  . Hearing loss     aide  . Hyperlipidemia   . Hypertension   . Colon polyps   . Hypothyroid     after thyroidectomy (Dr Forde Dandy)  . Hypoparathyroidism     surgical   . Breast cancer 1997    caught very early- bx was curative, then 26 radiology treatments   . 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 Degenerative disk disease none in last 5 years    lumbar- with back pain (s/p injections)    Past Surgical History  Procedure Laterality Date  . Thyroidectomy  2009 and 2011    last part of thryoid/neck disection and laryngectomy 1/12  . Laryngectomy  2012    for thyroid cancer  . Breast biopsy Left 1998    breast cancer   . Colonoscopy N/A 07/09/2013    Procedure: COLONOSCOPY;  Surgeon: Irene Shipper, MD;  Location: [facility];  Service: Endoscopy;  Laterality: N/A;   History  Substance Use Topics  . Smoking status: Never Smoker   . Smokeless tobacco: Never Used  . Alcohol Use: No   Family History  Problem Relation Age of Onset  . Alzheimer's disease Father   . Colon cancer Neg Hx    Allergies  Allergen Reactions  . Prednisone Swelling    REACTION: severe facial swelling   Current Outpatient Prescriptions on File Prior to Visit  Medication Sig Dispense Refill  . atorvastatin (LIPITOR) 80 MG tablet Take 80 mg by mouth every morning.       . calcitRIOL (ROCALTROL) 0.5 MCG capsule Take 0.5 mcg by mouth daily.       . calcium-vitamin D (OSCAL WITH D) 500-200 MG-UNIT per tablet Take 5 tablets by mouth 2 (two) times daily.       Marland Kitchen donepezil (ARICEPT ODT) 10 MG disintegrating tablet Take 1 tablet (10 mg total) by mouth at  bedtime.  30 tablet  11  . levothyroxine (SYNTHROID, LEVOTHROID) 112 MCG tablet Take 112 mcg by mouth See admin instructions. Takes Mon thru Fri daily, doesn't take on Saturday or Sunday      . losartan (COZAAR) 100 MG tablet Take 100 mg by mouth every morning.      . Multiple Vitamins-Iron (MULTIVITAMIN/IRON) TABS Take 1 tablet by mouth daily.       Marland Kitchen omeprazole (PRILOSEC) 20 MG capsule Take 20 mg  by mouth daily.        No current facility-administered medications on file prior to visit.   Review of Systems Review of Systems  Constitutional: Negative for fever, appetite change, fatigue and unexpected weight change.  Eyes: Negative for pain and visual disturbance.  Respiratory: Negative for cough and shortness of breath.   Cardiovascular: Negative for cp or palpitations    Gastrointestinal: Negative for nausea, diarrhea and constipation.  Genitourinary: Negative for urgency and frequency.  Skin: Negative for pallor or rash   Neurological: Negative for weakness, light-headedness, numbness and headaches.  Hematological: Negative for adenopathy. Does not bruise/bleed easily.  Psychiatric/Behavioral: Negative for dysphoric mood. The patient is not nervous/anxious.  pos for short term memory loss and occ confusion       Objective:   Physical Exam  Constitutional: She appears well-developed and well-nourished.  HENT:  Head: Normocephalic and atraumatic.  Right Ear: External ear normal.  Left Ear: External ear normal.  Mouth/Throat: Oropharynx is clear and moist.  Eyes: Conjunctivae and EOM are normal. Pupils are equal, round, and reactive to light. No scleral icterus.  Neck: Normal range of motion. Neck supple. No JVD present. Carotid bruit is not present. No thyromegaly present.  Neck scars from thyroid removal Trache in place - talks with trache  Cardiovascular: Normal rate, regular rhythm, normal heart sounds and intact distal pulses.  Exam reveals no gallop.   Pulmonary/Chest:  Effort normal and breath sounds normal. No respiratory distress. She has no wheezes. She exhibits no tenderness.  Abdominal: Soft. Bowel sounds are normal. She exhibits no distension, no abdominal bruit and no mass. There is no tenderness.  Genitourinary: No breast swelling, tenderness, discharge or bleeding.  Musculoskeletal: Normal range of motion. She exhibits no edema and no tenderness.  Lymphadenopathy:    She has no cervical adenopathy.  Neurological: She is alert. She has normal reflexes. She displays no atrophy. No cranial nerve deficit or sensory deficit. She exhibits normal muscle tone. Coordination and gait normal.  Skin: Skin is warm and dry. No rash noted. No erythema. No pallor.  Psychiatric: She has a normal mood and affect. Her behavior is normal. Thought content normal. Cognition and memory are impaired. She exhibits abnormal recent memory.          Assessment & Plan:   Problem List Items Addressed This Visit     Cardiovascular and Mediastinum   HYPERTENSION      bp in fair control at this time  BP Readings from Last 1 Encounters:  03/18/14 136/82   No changes needed Disc lifstyle change with low sodium diet and exercise  Labs reviewed  Enc low sodium diet       Other   HYPERLIPIDEMIA     Good control with lipitor  Interested in change to crestor in the future- her son will disc further with Dr Forde Dandy Was concerned VZ:DGLO to memory loss     Memory loss     Worsening  Tolerating aricept  req neuro consult -will refer  Disc safety issues in detail - her son watches her closely  Also do agree with accompanied driving only    Relevant Orders      Ambulatory referral to Neurology   Encounter for Medicare annual wellness exam - Primary     Reviewed health habits including diet and exercise and skin cancer prevention Reviewed appropriate screening tests for age  Also reviewed health mt list, fam hx and immunization status , as well as social and  family  history   See HPI Labs reviewed  Asked pt and son to work on Insurance underwriter (packet given) Mammogram upcoming  Will see neurology for memory problems     Frequent headaches     This is improved and CT reassuring but short term memory is worse even on aricept  Will ref to neurology for eval    Relevant Orders      Ambulatory referral to Neurology    Other Visit Diagnoses   Need for vaccination with 13-polyvalent pneumococcal conjugate vaccine        Relevant Orders       Pneumococcal conjugate vaccine 13-valent (Completed)

## 2014-03-18 NOTE — Assessment & Plan Note (Signed)
Worsening  Tolerating aricept  req neuro consult -will refer  Disc safety issues in detail - her son watches her closely  Also do agree with accompanied driving only

## 2014-03-18 NOTE — Progress Notes (Signed)
Pre visit review using our clinic review tool, if applicable. No additional management support is needed unless otherwise documented below in the visit note. 

## 2014-03-18 NOTE — Assessment & Plan Note (Signed)
This is improved and CT reassuring but short term memory is worse even on aricept  Will ref to neurology for eval

## 2014-03-18 NOTE — Assessment & Plan Note (Signed)
Reviewed health habits including diet and exercise and skin cancer prevention Reviewed appropriate screening tests for age  Also reviewed health mt list, fam hx and immunization status , as well as social and family history   See HPI Labs reviewed  Asked pt and son to work on Insurance underwriter (packet given) Mammogram upcoming  Will see neurology for memory problems

## 2014-03-21 ENCOUNTER — Encounter: Payer: Self-pay | Admitting: Neurology

## 2014-03-21 ENCOUNTER — Ambulatory Visit (INDEPENDENT_AMBULATORY_CARE_PROVIDER_SITE_OTHER): Payer: Medicare Other | Admitting: Neurology

## 2014-03-21 VITALS — BP 120/68 | HR 60 | Ht <= 58 in | Wt 108.2 lb

## 2014-03-21 DIAGNOSIS — R413 Other amnesia: Secondary | ICD-10-CM

## 2014-03-21 NOTE — Progress Notes (Signed)
NEUROLOGY CONSULTATION NOTE  Gloria Parker MRN: [id number] DOB: [birth year]  Referring provider: Dr. Loura Pardon Primary care provider: Dr. Loura Pardon  Reason for consult:  Memory loss  Dear Dr Glori Bickers:  Thank you for your kind referral of Gloria Parker for consultation of the above symptoms. Although her history is well known to you, please allow me to reiterate it for the purpose of our medical record. The patient was accompanied to the clinic by her son who also provides collateral information. Records and images were personally reviewed where available.  HISTORY OF PRESENT ILLNESS: This is a pleasant 73 year old right-handed woman with a history of breast cancer s/p chemotherapy and radiation, thyroid cancer s/p thyroidectomy and tracheostomy, hypertension, dyslipidemia, presenting for evaluation of memory loss that became noticeable around a year ago.  Her son reported that she started getting lost driving or would forget normal daily tasks.  She started Aricept, however due to worsening of symptoms, neurological evaluation was requested.  The patient reports that her memory used to be so good. She states that symptoms started when her husband passed away in 11-18-2009, and became emotional in the office.  Her son states that symptoms were noticed last year, but more in the past 6-7 months.  She would be in the middle of driving and forget where she was going. She has left the stove on a few times.  She asks the same questions repeatedly, or perseverates and gives the same answer to questions.  She would forget meetings or get togethers. For instance, her other son visited earlier in the day, then she forgot about this and her son found out about it later on.  Three months ago, she had her hearing aid checked, she came home and told her son that there was nothing they could do and she needed new hearing aids.  That weekend, they could not find her hearing aid, and later on found out that  she had actually left it at the doctor's office to be fixed.  Her son reports she loses her hearing aid or talking device 3-4 times a month.  Her son is her power of attorney, and over the past year has taken over paying her bills because she started paying her bills twice or three times.  In the past 3-4 months, she has withdrawn money from the bank but does not recall this.  She would miss her medication if her son does not remind her.  She has started noticing it herself as well, and has been more easily frustrated in the past 6-7 months. No history of falls or head injury.  Since January of this year, she started having sharp pains on the vertex lasting several hours, occurring 2-3 times a month. There is no associated nausea, vomiting, photo/phonophobia, focal numbness/tingling/weakness, dizziness.  She usually takes an Advil.  She had shingles in 11-18-2009 with pain in her head, followed by the rash on her body.  She was diagnosed with thyroid cancer in 11-19-2010.    I personally reviewed head CT with and without contrast done Nov 18, 2013 which showed mild atrophy and mild chronic microvascular changes, no abnormal enhancement.  Laboratory Data: Component     Latest Ref Rng 03/11/2014  WBC     4.0 - 10.5 K/uL 5.3  RBC     3.87 - 5.11 Mil/uL 4.00  Hemoglobin     12.0 - 15.0 g/dL 12.4  HCT     36.0 - 46.0 % 37.3  MCV     78.0 - 100.0 fl 93.3  MCHC     30.0 - 36.0 g/dL 33.2  RDW     11.5 - 15.5 % 14.3  Platelets     150.0 - 400.0 K/uL 317.0  Neutrophils Relative %     43.0 - 77.0 % 64.7  Lymphocytes Relative     12.0 - 46.0 % 26.6  Monocytes Relative     3.0 - 12.0 % 6.1  Eosinophils Relative     0.0 - 5.0 % 2.2  Basophils Relative     0.0 - 3.0 % 0.4  NEUT#     1.4 - 7.7 K/uL 3.4  Lymphocytes Absolute     0.7 - 4.0 K/uL 1.4  Monocytes Absolute     0.1 - 1.0 K/uL 0.3  Eosinophils Absolute     0.0 - 0.7 K/uL 0.1  Basophils Absolute     0.0 - 0.1 K/uL 0.0  Sodium     135 - 145 mEq/L  139  Potassium     3.5 - 5.1 mEq/L 4.3  Chloride     96 - 112 mEq/L 101  CO2     19 - 32 mEq/L 31  Glucose     70 - 99 mg/dL 95  BUN     6 - 23 mg/dL 15  Creatinine     0.4 - 1.2 mg/dL 0.9  Total Bilirubin     0.2 - 1.2 mg/dL 0.6  Alkaline Phosphatase     39 - 117 U/L 62  AST     0 - 37 U/L 18  ALT     0 - 35 U/L 19  Total Protein     6.0 - 8.3 g/dL 7.3  Albumin     3.5 - 5.2 g/dL 3.6  Calcium     8.4 - 10.5 mg/dL 9.4  GFR     >60.00 mL/min 63.68  Cholesterol     0 - 200 mg/dL 151  Triglycerides     0.0 - 149.0 mg/dL 95.0  HDL     >39.00 mg/dL 55.10  VLDL     0.0 - 40.0 mg/dL 19.0  LDL (calc)     0 - 99 mg/dL 77  Total CHOL/HDL Ratio      3  NonHDL      95.90  VITD     30.00 - 100.00 ng/mL 54.93    PAST MEDICAL HISTORY: Past Medical History  Diagnosis Date  . Vertigo 2007-2010  . Hearing loss     aide  . Hyperlipidemia   . Hypertension   . Colon polyps   . Hypothyroid     after thyroidectomy (Dr Forde Dandy)  . Hypoparathyroidism     surgical   . Breast cancer 1997    caught very early- bx was curative, then 49 radiology treatments   . 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 Degenerative disk disease none in last 5 years    lumbar- with back pain (s/p injections)     PAST SURGICAL HISTORY: Past Surgical History  Procedure Laterality Date  . Thyroidectomy  2009 and 2011    last part of thryoid/neck disection and laryngectomy 1/12  . Laryngectomy  2012    for thyroid cancer  . Breast biopsy Left 1998    breast cancer   . Colonoscopy N/A 07/09/2013    Procedure: COLONOSCOPY;  Surgeon: Irene Shipper, MD;  Location: [facility];  Service: Endoscopy;  Laterality: N/A;    MEDICATIONS: Current Outpatient Prescriptions on File Prior to Visit  Medication Sig Dispense Refill  . atorvastatin (LIPITOR) 80 MG tablet Take 80 mg by mouth every morning.       . calcitRIOL (ROCALTROL) 0.5 MCG capsule Take 0.5 mcg by mouth daily.         . calcium-vitamin D (OSCAL WITH D) 500-200 MG-UNIT per tablet Take 5 tablets by mouth 2 (two) times daily.       Marland Kitchen donepezil (ARICEPT ODT) 10 MG disintegrating tablet Take 1 tablet (10 mg total) by mouth at bedtime.  30 tablet  11  . levothyroxine (SYNTHROID, LEVOTHROID) 112 MCG tablet Take 112 mcg by mouth See admin instructions. Takes Mon thru Fri daily, doesn't take on Saturday or Sunday      . losartan (COZAAR) 100 MG tablet Take 100 mg by mouth every morning.      . Multiple Vitamins-Iron (MULTIVITAMIN/IRON) TABS Take 1 tablet by mouth daily.       . Multiple Vitamins-Minerals (PRESERVISION AREDS 2 PO) Take 1 tablet by mouth daily.      Marland Kitchen omeprazole (PRILOSEC) 20 MG capsule Take 20 mg by mouth daily.        No current facility-administered medications on file prior to visit.    ALLERGIES: Allergies  Allergen Reactions  . Prednisone Swelling    REACTION: severe facial swelling    FAMILY HISTORY: Family History  Problem Relation Age of Onset  . Alzheimer's disease Father   . Colon cancer Neg Hx     SOCIAL HISTORY: History   Social History  . Marital Status: Widowed    Spouse Name: N/A    Number of Children: 2  . Years of Education: N/A   Occupational History  . Retired      Used to do transcription    Social History Main Topics  . Smoking status: Never Smoker   . Smokeless tobacco: Never Used  . Alcohol Use: No  . Drug Use: No  . Sexual Activity: Not on file   Other Topics Concern  . Not on file   Social History Narrative   Regular exercise       Moved from [place] husband to coronary artery disease 4/11      One grandson       Gi - Dr. Hassie Bruce- Dr. Telford Nab, Dr. Ernestina Patches (spine injection)   Surg- Dr. Truitt Leep   ENT- Dr. Constance Holster    Surg- Dr. Rise Patience          REVIEW OF SYSTEMS: Constitutional: No fevers, chills, or sweats, no generalized fatigue, change in appetite Eyes: No visual changes, double vision, eye pain Ear, nose and  throat: No hearing loss, ear pain, nasal congestion, sore throat Cardiovascular: No chest pain, palpitations Respiratory:  No shortness of breath at rest or with exertion, wheezes GastrointestinaI: No nausea, vomiting, diarrhea, abdominal pain, fecal incontinence Genitourinary:  No dysuria, urinary retention or frequency Musculoskeletal:  No neck pain, back pain Integumentary: No rash, pruritus, skin lesions Neurological: as above Psychiatric: No depression, insomnia, anxiety Endocrine: No palpitations, fatigue, diaphoresis, mood swings, change in appetite, change in weight, increased thirst Hematologic/Lymphatic:  No anemia, purpura, petechiae. Allergic/Immunologic: no itchy/runny eyes, nasal congestion, recent allergic reactions, rashes  PHYSICAL EXAM: Filed Vitals:   03/21/14 1244  BP: 120/68  Pulse: 60   General: No acute distress Head:  Normocephalic/atraumatic Eyes: Fundoscopic exam shows bilateral sharp discs, no vessel  changes, exudates, or hemorrhages Neck: supple, no paraspinal tenderness, full range of motion Back: No paraspinal tenderness Heart: regular rate and rhythm Lungs: Clear to auscultation bilaterally. Vascular: No carotid bruits. Skin/Extremities: No rash, no edema Neurological Exam: Mental status: alert and oriented to person, place, month and year.  Did not know date or day of week. Fund of knowledge is appropriate.  Remote memory are intact.  Attention and concentration are normal.    Able to name objects and repeat phrases. Montreal Cognitive Assessment  03/22/2014  Visuospatial/ Executive (0/5) 5  Naming (0/3) 3  Attention: Read list of digits (0/2) 2  Attention: Read list of letters (0/1) 1  Attention: Serial 7 subtraction starting at 100 (0/3) 3  Language: Repeat phrase (0/2) 2  Language : Fluency (0/1) 0  Abstraction (0/2) 2  Delayed Recall (0/5) 0  Orientation (0/6) 4  Total 22  Adjusted Score (based on education) 22   Cranial nerves: CN I:  not tested CN II: pupils equal, round and reactive to light, visual fields intact, fundi unremarkable. CN III, IV, VI:  full range of motion, no nystagmus, no ptosis CN V: facial sensation intact CN VII: upper and lower face symmetric CN VIII: hearing intact to finger rub CN IX, X: gag intact, uvula midline CN XI: sternocleidomastoid and trapezius muscles intact CN XII: tongue midline Bulk & Tone: normal, no fasciculations. Motor: 5/5 throughout with no pronator drift. Sensation: intact to light touch, cold, pin, vibration and joint position sense.  No extinction to double simultaneous stimulation.  Romberg test negative Deep Tendon Reflexes: +2 throughout, no ankle clonus Plantar responses: downgoing bilaterally Cerebellar: no incoordination on finger to nose, heel to shin. No dysdiadochokinesia Gait: narrow-based and steady, able to tandem walk adequately. Tremor: none  IMPRESSION: This is a pleasant 73 year old right-handed woman with a history of breast cancer s/p chemo and radiation, thyroid cancer s/p thyroidectomy and tracheostomy, hypertension, dyslipidemia, presenting for worsening memory loss over the past year.  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  She is now on Aricept 10mg /day, continue current dose.  Check B12 level. MRI brain with and without contrast will be ordered to assess for underlying structural abnormality and vascular load.  We discussed the importance of control of vascular risk factors, physical exercise, and brain stimulation exercises for brain health.  She has gotten lost driving, and has been instructed to stop driving.  She will follow-up in 4 months.  Thank you for allowing me to participate in the care of this patient. Please do not hesitate to call for any questions or concerns.   Ellouise Newer, M.D.  CC: Dr. Glori Bickers

## 2014-03-21 NOTE — Patient Instructions (Addendum)
1. MRI brain with and without contrast. We have scheduled you at [facility] for your MRI on 04/02/2014 at 12:00pm. Please arrive 15 minutes prior and [place]. If you need to reschedule for any reason please call [phone]. 2. Continue Aricept 3. No driving 4. Physical exercises and brain stimulation exercises (crossword puzzles, word search, reading), and social interaction, has been found to be helpful for brain health 5. Follow-up in 4 months

## 2014-03-22 ENCOUNTER — Encounter: Payer: Self-pay | Admitting: Neurology

## 2014-03-31 ENCOUNTER — Ambulatory Visit
Admission: RE | Admit: 2014-03-31 | Discharge: 2014-03-31 | Disposition: A | Discharge status: Home / Self Care | Payer: Medicare Other | Source: Ambulatory Visit

## 2014-03-31 DIAGNOSIS — Z9889 Other specified postprocedural states: Secondary | ICD-10-CM

## 2014-03-31 DIAGNOSIS — Z1231 Encounter for screening mammogram for malignant neoplasm of breast: Secondary | ICD-10-CM

## 2014-03-31 DIAGNOSIS — Z853 Personal history of malignant neoplasm of breast: Secondary | ICD-10-CM

## 2014-04-01 ENCOUNTER — Encounter: Payer: Self-pay | Admitting: *Deleted

## 2014-04-02 ENCOUNTER — Ambulatory Visit (HOSPITAL_COMMUNITY)
Admission: RE | Admit: 2014-04-02 | Discharge: 2014-04-02 | Disposition: A | Discharge status: Home / Self Care | Payer: Medicare Other | Source: Ambulatory Visit | Attending: Neurology | Admitting: Neurology

## 2014-04-02 DIAGNOSIS — J328 Other chronic sinusitis: Secondary | ICD-10-CM | POA: Insufficient documentation

## 2014-04-02 DIAGNOSIS — R413 Other amnesia: Secondary | ICD-10-CM | POA: Insufficient documentation

## 2014-04-02 DIAGNOSIS — Z853 Personal history of malignant neoplasm of breast: Secondary | ICD-10-CM | POA: Insufficient documentation

## 2014-04-02 DIAGNOSIS — I6789 Other cerebrovascular disease: Secondary | ICD-10-CM | POA: Insufficient documentation

## 2014-04-02 MED ORDER — GADOBENATE DIMEGLUMINE 529 MG/ML IV SOLN
9.0000 mL | Freq: Once | INTRAVENOUS | Status: AC | PRN
  Administered 2014-04-02: 9 mL via INTRAVENOUS

## 2014-04-20 ENCOUNTER — Other Ambulatory Visit: Payer: Self-pay | Admitting: Family Medicine

## 2014-05-23 ENCOUNTER — Other Ambulatory Visit: Payer: Self-pay | Admitting: *Deleted

## 2014-05-23 MED ORDER — DONEPEZIL HCL 10 MG PO TBDP: 10.0000 mg | ORAL_TABLET | Freq: Every day | ORAL | Status: DC

## 2014-05-23 NOTE — Telephone Encounter (Signed)
Received fax saying pt request Rx to go to Mail order pharmacy, new Rx sent

## 2014-07-21 ENCOUNTER — Ambulatory Visit (INDEPENDENT_AMBULATORY_CARE_PROVIDER_SITE_OTHER): Payer: Medicare Other | Admitting: Neurology

## 2014-07-21 ENCOUNTER — Encounter: Payer: Self-pay | Admitting: Neurology

## 2014-07-21 VITALS — BP 132/76 | HR 84 | Ht 60.0 in | Wt 118.0 lb

## 2014-07-21 DIAGNOSIS — G3184 Mild cognitive impairment, so stated: Secondary | ICD-10-CM

## 2014-07-21 NOTE — Patient Instructions (Signed)
1. Continue Aricept 10mg  daily 2. Physical and brain stimulation exercises are important for brain health 3. Follow-up in 6 months

## 2014-07-21 NOTE — Progress Notes (Signed)
NEUROLOGY FOLLOW UP OFFICE NOTE  Gloria Parker [id number]  HISTORY OF PRESENT ILLNESS: I had the pleasure of seeing Gloria Parker in follow-up in the neurology clinic on 07/21/2014. She is again accompanied by her son who supplements the history. The patient was last seen 4 month ago for worsening memory loss, MOCA indicated mild cognitive impairment. Records and images were personally reviewed where available.  I personally reviewed MRI brain with and without contrast which did not show any acute changes, no evidence of metastasis. There was mild generalized cerebral atrophy, more over the temporal lobes, mild to moderate bilateral chronic microvascular changes. She is tolerating Aricept with no side effects. She reports that she is feeling good, "my body is perfect." She denies leaving the stove on, however her son shakes his head. Her son reports that there have been no significant changes since her last visit. She asks the same questions repeatedly. She has stopped driving.  She denies any headaches, dizziness, diplopia, focal numbness/tingling/weakness, bowel/bladder dysfunction.  HPI: This is a pleasant 73 yo RH woman with a history of breast cancer s/p chemotherapy and radiation, thyroid cancer s/p thyroidectomy and tracheostomy, hypertension, dyslipidemia, with memory loss that became noticeable around 2012/11/06. Her son reported that she started getting lost driving or would forget normal daily tasks. She started Aricept, however due to worsening of symptoms, neurological evaluation was requested. The patient reports that her memory used to be so good. She states that symptoms started when her husband passed away in 2009-11-06, and became emotional in the office. Her son states that symptoms were noticed last year, but more in the past 6-7 months. She would be in the middle of driving and forget where she was going. She has left the stove on a few times. She asks the same questions  repeatedly, or perseverates and gives the same answer to questions. She would forget meetings or get togethers. Her son is her power of attorney, and over the past year has taken over paying her bills because she started paying her bills twice or three times. She has withdrawn money from the bank but does not recall this. She would miss her medication if her son does not remind her. She has started noticing it herself as well, and has been more easily frustrated in the past 6-7 months. No history of falls or head injury.  PAST MEDICAL HISTORY: Past Medical History  Diagnosis Date  . Vertigo 2007-2010  . Hearing loss     aide  . Hyperlipidemia   . Hypertension   . Colon polyps   . Hypothyroid     after thyroidectomy (Dr Gloria Parker)  . Hypoparathyroidism     surgical   . Breast cancer 1997    caught very early- bx was curative, then 43 radiology treatments   . 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 Degenerative disk disease none in last 5 years    lumbar- with back pain (s/p injections)     MEDICATIONS: Current Outpatient Prescriptions on File Prior to Visit  Medication Sig Dispense Refill  . atorvastatin (LIPITOR) 80 MG tablet Take 80 mg by mouth every morning.     . calcitRIOL (ROCALTROL) 0.5 MCG capsule Take 0.5 mcg by mouth daily.     . calcium-vitamin D (OSCAL WITH D) 500-200 MG-UNIT per tablet Take 5 tablets by mouth 2 (two) times daily.     Marland Kitchen donepezil (ARICEPT ODT) 10 MG disintegrating tablet Take 1 tablet (10  mg total) by mouth at bedtime. 90 tablet 3  . levothyroxine (SYNTHROID, LEVOTHROID) 112 MCG tablet Take 112 mcg by mouth See admin instructions. Takes Mon thru Fri daily, doesn't take on Saturday or Sunday    . losartan (COZAAR) 100 MG tablet TAKE 1 TABLET (100 MG TOTAL) BY MOUTH DAILY. 90 tablet 1  . omeprazole (PRILOSEC) 20 MG capsule Take 20 mg by mouth daily.      No current facility-administered medications on file prior to visit.     ALLERGIES: Allergies  Allergen Reactions  . Prednisone Swelling    REACTION: severe facial swelling    FAMILY HISTORY: Family History  Problem Relation Age of Onset  . Alzheimer's disease Father   . Colon cancer Neg Hx     SOCIAL HISTORY: History   Social History  . Marital Status: Widowed    Spouse Name: N/A    Number of Children: 2  . Years of Education: N/A   Occupational History  . Retired      Used to do transcription    Social History Main Topics  . Smoking status: Never Smoker   . Smokeless tobacco: Never Used  . Alcohol Use: No  . Drug Use: No  . Sexual Activity: Not on file   Other Topics Concern  . Not on file   Social History Narrative   Regular exercise       Moved from [place] husband to coronary artery disease 4/11      One grandson       Gi - Dr. Hassie Parker- Dr. Telford Parker, Dr. Ernestina Parker (spine injection)   Surg- Dr. Truitt Parker   ENT- Dr. Constance Parker    Surg- Dr. Rise Parker          REVIEW OF SYSTEMS: Constitutional: No fevers, chills, or sweats, no generalized fatigue, change in appetite Eyes: No visual changes, double vision, eye pain Ear, nose and throat: No hearing loss, ear pain, nasal congestion, sore throat Cardiovascular: No chest pain, palpitations Respiratory:  No shortness of breath at rest or with exertion, wheezes GastrointestinaI: No nausea, vomiting, diarrhea, abdominal pain, fecal incontinence Genitourinary:  No dysuria, urinary retention or frequency Musculoskeletal:  No neck pain, back pain Integumentary: No rash, pruritus, skin lesions Neurological: as above Psychiatric: No depression, insomnia, anxiety Endocrine: No palpitations, fatigue, diaphoresis, mood swings, change in appetite, change in weight, increased thirst Hematologic/Lymphatic:  No anemia, purpura, petechiae. Allergic/Immunologic: no itchy/runny eyes, nasal congestion, recent allergic reactions, rashes  PHYSICAL EXAM: Filed Vitals:    07/21/14 0742  BP: 132/76  Pulse: 84   General: No acute distress, s/p trach with hoarse voice Head:  Normocephalic/atraumatic Neck: supple, no paraspinal tenderness, full range of motion Heart:  Regular rate and rhythm Lungs:  Clear to auscultation bilaterally Back: No paraspinal tenderness Skin/Extremities: No rash, no edema Neurological Exam: alert and oriented to person, place, and month. Missed date and day of week (similar to prior). No aphasia or dysarthria. Fund of knowledge is appropriate.  Remote memory intact.  Attention and concentration are normal.    Able to name objects and repeat phrases.  Montreal Cognitive Assessment  07/21/2014 03/22/2014  Visuospatial/ Executive (0/5) 5 5  Naming (0/3) 2 3  Attention: Read list of digits (0/2) 2 2  Attention: Read list of letters (0/1) 1 1  Attention: Serial 7 subtraction starting at 100 (0/3) 3 3  Language: Repeat phrase (0/2) 2 2  Language : Fluency (0/1) 0 0  Abstraction (0/2) 2 2  Delayed Recall (0/5) 1 0  Orientation (0/6) 4 4  Total 22 22  Adjusted Score (based on education) 22 22   Cranial nerves: Pupils equal, round, reactive to light.  Extraocular movements intact with no nystagmus. Visual fields full. Facial sensation intact. No facial asymmetry. Tongue, uvula, palate midline.  Motor: Bulk and tone normal, muscle strength 5/5 throughout with no pronator drift.  Sensation to light touch intact.  No extinction to double simultaneous stimulation.  Deep tendon reflexes 2+ throughout, toes downgoing.  Finger to nose testing intact.  Gait narrow-based and steady, able to tandem walk adequately.   IMPRESSION: This is a pleasant 73 yo RH woman with a history of breast cancer s/p chemo and radiation, thyroid cancer s/p thyroidectomy and tracheostomy, hypertension, dyslipidemia, with mild cognitive impairment, possible mild dementia. MRI brain showed generalized atrophy, more over the temporal lobes, no acute changes. Her MOCA today is  similar to prior done 4 months ago, 22/30, however her son reports she did better with delayed recall, she was able to retrieve words with category clues and retain them better with immediate recall. We discussed indications for medication, at this time, continue Aricept 10mg /day. No clear indication to add on Namenda. , continue current dose.We again discussed the importance of control of vascular risk factors, physical exercise, and brain stimulation exercises for brain health. She has stopped driving. She will follow-up in 6 months.   Thank you for allowing me to participate in her care.  Please do not hesitate to call for any questions or concerns.  The duration of this appointment visit was 15 minutes of face-to-face time with the patient.  Greater than 50% of this time was spent in counseling, explanation of diagnosis, planning of further management, and coordination of care.   Ellouise Newer, M.D.   CC: Dr. Glori Bickers

## 2014-07-30 ENCOUNTER — Telehealth: Payer: Self-pay | Admitting: Family Medicine

## 2014-07-30 NOTE — Telephone Encounter (Signed)
LM for pt to return call to schedule flu shot.

## 2014-10-16 ENCOUNTER — Other Ambulatory Visit: Payer: Self-pay | Admitting: Family Medicine

## 2015-01-20 ENCOUNTER — Ambulatory Visit: Payer: Medicare Other | Admitting: Neurology

## 2015-01-21 ENCOUNTER — Ambulatory Visit (INDEPENDENT_AMBULATORY_CARE_PROVIDER_SITE_OTHER): Payer: Medicare Other | Admitting: Neurology

## 2015-01-21 ENCOUNTER — Encounter: Payer: Self-pay | Admitting: Neurology

## 2015-01-21 VITALS — BP 120/86 | HR 67 | Resp 16 | Ht 60.0 in | Wt 122.0 lb

## 2015-01-21 DIAGNOSIS — G3184 Mild cognitive impairment, so stated: Secondary | ICD-10-CM | POA: Diagnosis not present

## 2015-01-21 NOTE — Patient Instructions (Signed)
1. Continue Aricept 10mg  daily 2. Continue control of BP, cholesterol, and physical exercise, brain stimulation exercises for brain health 3. Follow-up in 1 year

## 2015-01-21 NOTE — Progress Notes (Signed)
NEUROLOGY FOLLOW UP OFFICE NOTE  Gloria Parker [id number]  HISTORY OF PRESENT ILLNESS: I had the pleasure of seeing Gloria Parker in follow-up in the neurology clinic on 01/21/2015.  The patient was last seen 6 months ago for worsening memory loss. MOCA score at that time was 22/30, indicating mild cognitive impairment. She is again accompanied by her son who helps supplement the history today. She reports that she has been doing well, she feels her memory is fine. Her son reports memory has been stable, she writes everything down and keeps a calendar. She exercises regularly and does Sudoku. She is tolerating Aricept 10mg /day with no side effects. The headaches have resolved. She denies any headaches, dizziness, diplopia, focal numbness/tingling/weakness, bowel/bladder dysfunction. She does not drive.  HPI: This is a pleasant 74 yo RH woman with a history of breast cancer s/p chemotherapy and radiation, thyroid cancer s/p thyroidectomy and tracheostomy, hypertension, dyslipidemia, with memory loss that became noticeable around 11-15-12. Her son reported that she started getting lost driving or would forget normal daily tasks. She started Aricept, however due to worsening of symptoms, neurological evaluation was requested. The patient reports that her memory used to be so good. She states that symptoms started when her husband passed away in 11/15/2009, and became emotional in the office. Her son states that symptoms were noticed last year, but more in the past 6-7 months. She would be in the middle of driving and forget where she was going. She has left the stove on a few times. She asks the same questions repeatedly, or perseverates and gives the same answer to questions. She would forget meetings or get togethers. Her son is her power of attorney, and over the past year has taken over paying her bills because she started paying her bills twice or three times. She has withdrawn money from the bank  but does not recall this. She would miss her medication if her son does not remind her. She has started noticing it herself as well, and has been more easily frustrated in the past 6-7 months. No history of falls or head injury.  Diagnostic Data: I personally reviewed MRI brain with and without contrast which did not show any acute changes, no evidence of metastasis. There was mild generalized cerebral atrophy, more over the temporal lobes, mild to moderate bilateral chronic microvascular changes.   PAST MEDICAL HISTORY: Past Medical History  Diagnosis Date  . Vertigo 2007-2010  . Hearing loss     aide  . Hyperlipidemia   . Hypertension   . Colon polyps   . Hypothyroid     after thyroidectomy (Dr Forde Dandy)  . Hypoparathyroidism     surgical   . Breast cancer 1997    caught very early- bx was curative, then 41 radiology treatments   . Adenocarcinoma 2007-11-16 and November 15, 2009    thyroid with thyroidectomy   . Degenerative disk disease none in last 5 years    lumbar- with back pain (s/p injections)     MEDICATIONS: Current Outpatient Prescriptions on File Prior to Visit  Medication Sig Dispense Refill  . atorvastatin (LIPITOR) 80 MG tablet Take 80 mg by mouth every morning.     . calcitRIOL (ROCALTROL) 0.5 MCG capsule Take 0.5 mcg by mouth daily.     . calcium-vitamin D (OSCAL WITH D) 500-200 MG-UNIT per tablet Take 5 tablets by mouth 2 (two) times daily.     Marland Kitchen donepezil (ARICEPT ODT) 10 MG disintegrating tablet Take 1 tablet (10  mg total) by mouth at bedtime. 90 tablet 3  . levothyroxine (SYNTHROID, LEVOTHROID) 112 MCG tablet Take 112 mcg by mouth See admin instructions. Takes 1 tablet M-F and 1/2 tablet on Saturday    . losartan (COZAAR) 100 MG tablet TAKE 1 TABLET (100 MG TOTAL) BY MOUTH DAILY. 90 tablet 3  . omeprazole (PRILOSEC) 20 MG capsule Take 20 mg by mouth daily.      No current facility-administered medications on file prior to visit.    ALLERGIES: Allergies  Allergen Reactions    . Prednisone Swelling    REACTION: severe facial swelling    FAMILY HISTORY: Family History  Problem Relation Age of Onset  . Alzheimer's disease Father   . Colon cancer Neg Hx     SOCIAL HISTORY: History   Social History  . Marital Status: Widowed    Spouse Name: N/A  . Number of Children: 2  . Years of Education: N/A   Occupational History  . Retired      Used to do transcription    Social History Main Topics  . Smoking status: Never Smoker   . Smokeless tobacco: Never Used  . Alcohol Use: No  . Drug Use: No  . Sexual Activity: Not on file   Other Topics Concern  . Not on file   Social History Narrative   Regular exercise       Moved from [place] husband to coronary artery disease 4/11      One grandson       Gi - Dr. Hassie Bruce- Dr. Telford Nab, Dr. Ernestina Patches (spine injection)   Surg- Dr. Truitt Leep   ENT- Dr. Constance Holster    Surg- Dr. Rise Patience          REVIEW OF SYSTEMS: Constitutional: No fevers, chills, or sweats, no generalized fatigue, change in appetite Eyes: No visual changes, double vision, eye pain Ear, nose and throat: No hearing loss, ear pain, nasal congestion, sore throat Cardiovascular: No chest pain, palpitations Respiratory:  No shortness of breath at rest or with exertion, wheezes GastrointestinaI: No nausea, vomiting, diarrhea, abdominal pain, fecal incontinence Genitourinary:  No dysuria, urinary retention or frequency Musculoskeletal:  No neck pain, back pain Integumentary: No rash, pruritus, skin lesions Neurological: as above Psychiatric: No depression, insomnia, anxiety Endocrine: No palpitations, fatigue, diaphoresis, mood swings, change in appetite, change in weight, increased thirst Hematologic/Lymphatic:  No anemia, purpura, petechiae. Allergic/Immunologic: no itchy/runny eyes, nasal congestion, recent allergic reactions, rashes  PHYSICAL EXAM: Filed Vitals:   01/21/15 0823  BP: 120/86  Pulse: 67  Resp: 16    General: No acute distress Head:  Normocephalic/atraumatic Neck: supple, no paraspinal tenderness, full range of motion Heart:  Regular rate and rhythm Lungs:  Clear to auscultation bilaterally Back: No paraspinal tenderness Skin/Extremities: No rash, no edema Neurological Exam: alert and oriented to person, place, and month/year. No aphasia or dysarthria. Fund of knowledge is appropriate.  Remote memory intact.  Attention and concentration are normal.    Able to name objects and repeat phrases.  Montreal Cognitive Assessment  01/21/2015 07/21/2014 03/22/2014  Visuospatial/ Executive (0/5) 5 5 5   Naming (0/3) 3 2 3   Attention: Read list of digits (0/2) 2 2 2   Attention: Read list of letters (0/1) 1 1 1   Attention: Serial 7 subtraction starting at 100 (0/3) 3 3 3   Language: Repeat phrase (0/2) 2 2 2   Language : Fluency (0/1) 1 0 0  Abstraction (0/2) 2 2  2  Delayed Recall (0/5) 0 1 0  Orientation (0/6) 4 4 4   Total 23 22 22   Adjusted Score (based on education) 23 22 22     Cranial nerves: Pupils equal, round, reactive to light.  Fundoscopic exam unremarkable, no papilledema. Extraocular movements intact with no nystagmus. Visual fields full. Facial sensation intact. No facial asymmetry. Tongue, uvula, palate midline.  Motor: Bulk and tone normal, muscle strength 5/5 throughout with no pronator drift.  Sensation to light touch intact.  No extinction to double simultaneous stimulation.  Deep tendon reflexes 2+ throughout, toes downgoing.  Finger to nose testing intact.  Gait narrow-based and steady, able to tandem walk adequately.  Romberg negative.  IMPRESSION: This is a pleasant 74 yo RH woman with a history of breast cancer s/p chemo and radiation, thyroid cancer s/p thyroidectomy and tracheostomy, hypertension, dyslipidemia, with mild cognitive impairment. MOCA score today similar to prior visit, 23/30. Neurological exam normal. She is tolerating Aricept 10mg /day with no side effects. We  again discussed the importance of control of vascular risk factors, physical exercise, and brain stimulation exercises for brain health. She will follow-up in 1 year or earlier if needed.   Thank you for allowing me to participate in her care.  Please do not hesitate to call for any questions or concerns.  The duration of this appointment visit was 15 minutes of face-to-face time with the patient.  Greater than 50% of this time was spent in counseling, explanation of diagnosis, planning of further management, and coordination of care.   Ellouise Newer, M.D.   CC: Dr. Glori Bickers

## 2015-03-02 ENCOUNTER — Other Ambulatory Visit: Payer: Self-pay

## 2015-03-02 DIAGNOSIS — Z1231 Encounter for screening mammogram for malignant neoplasm of breast: Secondary | ICD-10-CM

## 2015-04-03 ENCOUNTER — Ambulatory Visit
Admission: RE | Admit: 2015-04-03 | Discharge: 2015-04-03 | Disposition: A | Discharge status: Home / Self Care | Payer: Medicare Other | Source: Ambulatory Visit

## 2015-04-03 DIAGNOSIS — Z1231 Encounter for screening mammogram for malignant neoplasm of breast: Secondary | ICD-10-CM

## 2015-04-03 LAB — HM MAMMOGRAPHY: HM Mammogram: NORMAL

## 2015-04-07 ENCOUNTER — Encounter: Payer: Self-pay | Admitting: Family Medicine

## 2015-04-07 ENCOUNTER — Encounter: Payer: Self-pay | Admitting: *Deleted

## 2015-05-26 ENCOUNTER — Other Ambulatory Visit: Payer: Self-pay | Admitting: Family Medicine

## 2015-07-20 ENCOUNTER — Other Ambulatory Visit: Payer: Self-pay | Admitting: *Deleted

## 2015-07-20 MED ORDER — LOSARTAN POTASSIUM 100 MG PO TABS: ORAL_TABLET | ORAL | Status: AC

## 2015-08-06 ENCOUNTER — Telehealth: Payer: Self-pay

## 2015-08-06 NOTE — Telephone Encounter (Signed)
Faxed notice from [facility] of notification of pts death. Fax on Dr Marliss Coots shelf and will need to be sent for scanning.

## 2015-08-16 ENCOUNTER — Telehealth: Payer: Self-pay | Admitting: Family Medicine

## 2015-08-16 NOTE — Telephone Encounter (Signed)
Please cancel this she is deceased

## 2015-08-16 NOTE — Telephone Encounter (Signed)
-----   Message from Ellamae Sia sent at 08/10/2015  1:33 PM EST ----- Regarding: Lab orders for Monday, 1.16.17 Patient is scheduled for CPX labs, please order future labs, Thanks , Karna Christmas

## 2015-08-17 ENCOUNTER — Other Ambulatory Visit: Payer: Medicare Other

## 2015-08-17 NOTE — Telephone Encounter (Signed)
All appts have been cancelled

## 2015-08-24 ENCOUNTER — Encounter: Payer: Medicare Other | Admitting: Family Medicine

## 2015-09-02 DEATH — deceased

## 2016-01-21 ENCOUNTER — Ambulatory Visit: Payer: Medicare Other | Admitting: Neurology
